# Patient Record
Sex: Female | Born: 1955 | Race: White | Hispanic: No | Marital: Married | Smoking: Never smoker
Health system: Southern US, Community
[De-identification: ages and names within clinical notes are randomized; demographics above are authoritative.]

## PROBLEM LIST (undated history)

## (undated) DIAGNOSIS — F32A Depression, unspecified: Secondary | ICD-10-CM

## (undated) DIAGNOSIS — F329 Major depressive disorder, single episode, unspecified: Secondary | ICD-10-CM

## (undated) DIAGNOSIS — R112 Nausea with vomiting, unspecified: Secondary | ICD-10-CM

## (undated) DIAGNOSIS — Z9889 Other specified postprocedural states: Secondary | ICD-10-CM

## (undated) HISTORY — PX: CHOLECYSTECTOMY: SHX55

## (undated) HISTORY — PX: OTHER SURGICAL HISTORY: SHX169

---

## 1990-05-12 HISTORY — PX: ABDOMINAL HYSTERECTOMY: SHX81

## 2001-10-10 ENCOUNTER — Encounter: Payer: Self-pay | Admitting: *Deleted

## 2001-10-10 ENCOUNTER — Ambulatory Visit (HOSPITAL_COMMUNITY): Admission: RE | Admit: 2001-10-10 | Discharge: 2001-10-10 | Payer: Self-pay | Admitting: *Deleted

## 2003-08-14 ENCOUNTER — Ambulatory Visit (HOSPITAL_COMMUNITY): Admission: RE | Admit: 2003-08-14 | Discharge: 2003-08-14 | Payer: Self-pay | Admitting: *Deleted

## 2004-12-26 ENCOUNTER — Ambulatory Visit (HOSPITAL_COMMUNITY): Admission: RE | Admit: 2004-12-26 | Discharge: 2004-12-26 | Payer: Self-pay | Admitting: Occupational Therapy

## 2005-11-13 ENCOUNTER — Emergency Department: Payer: Self-pay | Admitting: Emergency Medicine

## 2006-03-30 ENCOUNTER — Ambulatory Visit (HOSPITAL_COMMUNITY): Admission: RE | Admit: 2006-03-30 | Discharge: 2006-03-30 | Payer: Self-pay | Admitting: Nurse Practitioner

## 2008-04-25 ENCOUNTER — Ambulatory Visit (HOSPITAL_COMMUNITY): Admission: RE | Admit: 2008-04-25 | Discharge: 2008-04-25 | Payer: Self-pay | Admitting: Nurse Practitioner

## 2009-10-28 ENCOUNTER — Ambulatory Visit (HOSPITAL_COMMUNITY): Admission: RE | Admit: 2009-10-28 | Discharge: 2009-10-28 | Payer: Self-pay | Admitting: Nurse Practitioner

## 2011-04-14 ENCOUNTER — Other Ambulatory Visit (HOSPITAL_COMMUNITY): Payer: Self-pay | Admitting: Nurse Practitioner

## 2011-04-14 DIAGNOSIS — Z139 Encounter for screening, unspecified: Secondary | ICD-10-CM

## 2011-04-16 ENCOUNTER — Telehealth: Payer: Self-pay

## 2011-04-16 NOTE — Telephone Encounter (Signed)
Gastroenterology Pre-Procedure Form  Request Date: 04/14/2011   ,  Requesting Physician: Ninfa Linden, FNP     PATIENT INFORMATION:  Kelly Schultz is a 56 y.o., female (DOB=[birth year]).  PROCEDURE: Procedure(s) requested: colonoscopy Procedure Reason: screening for colon cancer  PATIENT REVIEW QUESTIONS: The patient reports the following:   1. Diabetes Melitis: no 2. Joint replacements in the past 12 months: no 3. Major health problems in the past 3 months: no 4. Has an artificial valve or MVP:no 5. Has been advised in past to take antibiotics in advance of a procedure like teeth cleaning: no}    MEDICATIONS & ALLERGIES:    Patient reports the following regarding taking any blood thinners:   Plavix? no Aspirin?no Coumadin?  no  Patient confirms/reports the following medications:  Current Outpatient Prescriptions  Medication Sig Dispense Refill  . buPROPion (WELLBUTRIN XL) 300 MG 24 hr tablet Take 300 mg by mouth daily.        . QUEtiapine (SEROQUEL) 50 MG tablet Take 50 mg by mouth at bedtime.        Marland Kitchen UNKNOWN TO PATIENT Vitamin D 1.25 twice weekly       . UNKNOWN TO PATIENT Vitamin D 1000 IU daily       . zolpidem (AMBIEN) 10 MG tablet Take 10 mg by mouth at bedtime as needed.          Patient confirms/reports the following allergies:  No Known Allergies  Patient is appropriate to schedule for requested procedure(s): yes  AUTHORIZATION INFORMATION Primary Insurance: BCBS    ID #: [id number]    Pre-Cert / Berkley Harvey required: Pre-Cert / Auth #:   Secondary Insurance: ,  ID #: ,  Group #:  Pre-Cert / Auth required: Pre-Cert / Auth #:  No orders of the defined types were placed in this encounter.    SCHEDULE INFORMATION: Procedure has been scheduled as follows:  Date: 04/28/2011    Time: 8:45 AM  Location: [facility] Short Stay  This Gastroenterology Pre-Precedure Form is being routed to the following provider(s) for review: [name], MD  Selena Batten is  aware of the appt.

## 2011-04-22 NOTE — Telephone Encounter (Signed)
MOVIPREP. 

## 2011-04-22 NOTE — Telephone Encounter (Signed)
MAY NEED PHENERGAN. 

## 2011-04-23 ENCOUNTER — Ambulatory Visit (HOSPITAL_COMMUNITY)
Admission: RE | Admit: 2011-04-23 | Discharge: 2011-04-23 | Disposition: A | Discharge status: Home / Self Care | Payer: BC Managed Care – PPO | Source: Ambulatory Visit | Attending: Nurse Practitioner | Admitting: Nurse Practitioner

## 2011-04-23 DIAGNOSIS — Z1231 Encounter for screening mammogram for malignant neoplasm of breast: Secondary | ICD-10-CM | POA: Insufficient documentation

## 2011-04-23 DIAGNOSIS — Z139 Encounter for screening, unspecified: Secondary | ICD-10-CM

## 2011-04-28 ENCOUNTER — Other Ambulatory Visit: Payer: Self-pay | Admitting: Gastroenterology

## 2011-04-28 ENCOUNTER — Encounter (HOSPITAL_COMMUNITY): Payer: Self-pay | Admitting: *Deleted

## 2011-04-28 ENCOUNTER — Encounter (HOSPITAL_COMMUNITY)
Admission: RE | Disposition: A | Discharge status: Home / Self Care | Payer: Self-pay | Source: Ambulatory Visit | Attending: Gastroenterology

## 2011-04-28 ENCOUNTER — Encounter: Payer: Self-pay | Admitting: Gastroenterology

## 2011-04-28 ENCOUNTER — Ambulatory Visit (HOSPITAL_COMMUNITY)
Admission: RE | Admit: 2011-04-28 | Discharge: 2011-04-28 | Disposition: A | Discharge status: Home / Self Care | Payer: BC Managed Care – PPO | Source: Ambulatory Visit | Attending: Gastroenterology | Admitting: Gastroenterology

## 2011-04-28 DIAGNOSIS — Z1211 Encounter for screening for malignant neoplasm of colon: Secondary | ICD-10-CM | POA: Insufficient documentation

## 2011-04-28 DIAGNOSIS — K648 Other hemorrhoids: Secondary | ICD-10-CM

## 2011-04-28 DIAGNOSIS — D128 Benign neoplasm of rectum: Secondary | ICD-10-CM | POA: Insufficient documentation

## 2011-04-28 DIAGNOSIS — D126 Benign neoplasm of colon, unspecified: Secondary | ICD-10-CM | POA: Insufficient documentation

## 2011-04-28 DIAGNOSIS — K5730 Diverticulosis of large intestine without perforation or abscess without bleeding: Secondary | ICD-10-CM

## 2011-04-28 DIAGNOSIS — D129 Benign neoplasm of anus and anal canal: Secondary | ICD-10-CM | POA: Insufficient documentation

## 2011-04-28 HISTORY — DX: Depression, unspecified: F32.A

## 2011-04-28 HISTORY — PX: COLONOSCOPY: SHX5424

## 2011-04-28 HISTORY — DX: Other specified postprocedural states: R11.2

## 2011-04-28 HISTORY — DX: Major depressive disorder, single episode, unspecified: F32.9

## 2011-04-28 HISTORY — DX: Other specified postprocedural states: Z98.890

## 2011-04-28 SURGERY — COLONOSCOPY
Anesthesia: Moderate Sedation

## 2011-04-28 MED ORDER — SODIUM CHLORIDE 0.45 % IV SOLN
Freq: Once | INTRAVENOUS | Status: AC
  Administered 2011-04-28: 09:00:00 via INTRAVENOUS

## 2011-04-28 MED ORDER — STERILE WATER FOR IRRIGATION IR SOLN
Status: DC | PRN
  Administered 2011-04-28: 10:00:00

## 2011-04-28 MED ORDER — PROMETHAZINE HCL 25 MG/ML IJ SOLN
INTRAMUSCULAR | Status: DC | PRN
  Administered 2011-04-28: 12.5 mg via INTRAVENOUS

## 2011-04-28 MED ORDER — MEPERIDINE HCL 100 MG/ML IJ SOLN
INTRAMUSCULAR | Status: DC | PRN
  Administered 2011-04-28: 25 mg via INTRAVENOUS
  Administered 2011-04-28: 50 mg via INTRAVENOUS
  Administered 2011-04-28: 25 mg via INTRAVENOUS

## 2011-04-28 MED ORDER — MEPERIDINE HCL 100 MG/ML IJ SOLN
INTRAMUSCULAR | Status: AC
  Filled 2011-04-28: qty 2

## 2011-04-28 MED ORDER — PROMETHAZINE HCL 25 MG/ML IJ SOLN
INTRAMUSCULAR | Status: AC
  Filled 2011-04-28: qty 1

## 2011-04-28 MED ORDER — MIDAZOLAM HCL 5 MG/5ML IJ SOLN
INTRAMUSCULAR | Status: DC | PRN
  Administered 2011-04-28 (×3): 2 mg via INTRAVENOUS

## 2011-04-28 MED ORDER — MIDAZOLAM HCL 5 MG/5ML IJ SOLN
INTRAMUSCULAR | Status: AC
  Filled 2011-04-28: qty 10

## 2011-04-28 MED ORDER — SODIUM CHLORIDE 0.9 % IJ SOLN
INTRAMUSCULAR | Status: AC
  Filled 2011-04-28: qty 10

## 2011-04-28 NOTE — Interval H&P Note (Signed)
History and Physical Interval Note:   04/28/2011   10:44 AM   Kelly Schultz  has presented today for surgery, with the diagnosis of screening  The various methods of treatment have been discussed with the patient and family. After consideration of risks, benefits and other options for treatment, the patient has consented to  Procedure(s): COLONOSCOPY as a surgical intervention .  I have reviewed the patients' chart and labs.  Questions were answered to the patient's satisfaction.     [name]  MD

## 2011-04-28 NOTE — H&P (Signed)
  Primary Care Physician:  Ninfa Linden, NP, NP Primary Gastroenterologist:  Dr. Darrick Penna  Pre-Procedure History & Physical: HPI:  Kelly Schultz is a 55 y.o. female here for screening TCS.  Past Medical History  Diagnosis Date  . PONV (postoperative nausea and vomiting)   . Depression     Past Surgical History  Procedure Date  . Cholecystectomy   . Cesarean section 10/23/1989  . Abdominal hysterectomy 05/1990  .  vertical ring gastroplasty     Prior to Admission medications   Medication Sig Start Date End Date Taking? Authorizing Provider  buPROPion (WELLBUTRIN XL) 300 MG 24 hr tablet Take 300 mg by mouth daily.     Yes Historical Provider, MD  QUEtiapine (SEROQUEL) 50 MG tablet Take 50 mg by mouth at bedtime.     Yes Historical Provider, MD  UNKNOWN TO PATIENT Vitamin D 1.25 twice weekly    Yes Historical Provider, MD  UNKNOWN TO PATIENT Vitamin D 1000 IU daily    Yes Historical Provider, MD  zolpidem (AMBIEN) 10 MG tablet Take 10 mg by mouth at bedtime as needed.     Yes Historical Provider, MD    Allergies as of 04/16/2011  . (No Known Allergies)    FAMHx: NO COLON CA OR POLYPS  History   Social History  . Marital Status: Married            .     Social History Main Topics  . Smoking status: Never Smoker   . Smokeless tobacco: Not on file  . Alcohol Use: No  . Drug Use: No  . Sexually Active:      Review of Systems: See HPI, otherwise negative ROS   Physical Exam: BP 127/86  Pulse 72  Temp(Src) 98.2 F (36.8 C) (Oral)  Resp 18  Ht 5\' 8"  (1.727 m)  Wt 108.863 kg (240 lb)  BMI 36.49 kg/m2  SpO2 100% General:   Alert,  pleasant and cooperative in NAD Head:  Normocephalic and atraumatic. Neck:  Supple; no masses or thyromegaly. Lungs:  Clear throughout to auscultation.    Heart:  Regular rate and rhythm. Abdomen:  Soft, nontender and nondistended. Normal bowel sounds, without guarding, and without rebound.   Neurologic:  Alert and  oriented  x4;  grossly normal neurologically.  Impression/Plan:   AVERAGE RISK FOR COLON CANCER. TCS TODAY. ALL QUESTIONS ANSWERED. RISKS AND BENEFITS DISCUSSED.

## 2011-05-05 ENCOUNTER — Telehealth: Payer: Self-pay | Admitting: Gastroenterology

## 2011-05-05 NOTE — Telephone Encounter (Signed)
Please call pt. 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 TCS in 5 years. High fiber diet. She should not get Demerol IV in the future because it cause phlebitis.  TCS in 5 years WITH OVERTUBE AND FENTANYL. HAD PHELBITIS WITH DEMEROL.

## 2011-05-06 NOTE — Telephone Encounter (Signed)
LMOM to call.

## 2011-05-06 NOTE — Telephone Encounter (Signed)
Pt informed

## 2011-05-06 NOTE — Telephone Encounter (Signed)
Path report Cc to Dr. Jarold Motto & 5 yr repeat TCS is in the computer

## 2011-05-07 ENCOUNTER — Encounter (HOSPITAL_COMMUNITY): Payer: Self-pay | Admitting: Gastroenterology

## 2013-12-06 ENCOUNTER — Other Ambulatory Visit (HOSPITAL_COMMUNITY): Payer: Self-pay | Admitting: Family Medicine

## 2013-12-06 DIAGNOSIS — Z1231 Encounter for screening mammogram for malignant neoplasm of breast: Secondary | ICD-10-CM

## 2013-12-14 ENCOUNTER — Ambulatory Visit (HOSPITAL_COMMUNITY)
Admission: RE | Admit: 2013-12-14 | Discharge: 2013-12-14 | Disposition: A | Discharge status: Home / Self Care | Payer: BC Managed Care – PPO | Source: Ambulatory Visit | Attending: Nurse Practitioner | Admitting: Nurse Practitioner

## 2013-12-14 DIAGNOSIS — Z1231 Encounter for screening mammogram for malignant neoplasm of breast: Secondary | ICD-10-CM | POA: Insufficient documentation

## 2015-10-01 ENCOUNTER — Other Ambulatory Visit (HOSPITAL_COMMUNITY): Payer: Self-pay | Admitting: Nurse Practitioner

## 2015-10-01 DIAGNOSIS — Z1231 Encounter for screening mammogram for malignant neoplasm of breast: Secondary | ICD-10-CM

## 2015-10-04 ENCOUNTER — Ambulatory Visit (HOSPITAL_COMMUNITY)
Admission: RE | Admit: 2015-10-04 | Discharge: 2015-10-04 | Disposition: A | Discharge status: Home / Self Care | Payer: BC Managed Care – PPO | Source: Ambulatory Visit | Attending: Nurse Practitioner | Admitting: Nurse Practitioner

## 2015-10-04 DIAGNOSIS — Z1231 Encounter for screening mammogram for malignant neoplasm of breast: Secondary | ICD-10-CM | POA: Diagnosis not present

## 2015-10-15 ENCOUNTER — Emergency Department
Admission: EM | Admit: 2015-10-15 | Discharge: 2015-10-15 | Disposition: A | Discharge status: Home / Self Care | Payer: BC Managed Care – PPO | Attending: Emergency Medicine | Admitting: Emergency Medicine

## 2015-10-15 ENCOUNTER — Emergency Department: Payer: BC Managed Care – PPO

## 2015-10-15 ENCOUNTER — Encounter: Payer: Self-pay | Admitting: *Deleted

## 2015-10-15 DIAGNOSIS — Y92002 Bathroom of unspecified non-institutional (private) residence single-family (private) house as the place of occurrence of the external cause: Secondary | ICD-10-CM | POA: Insufficient documentation

## 2015-10-15 DIAGNOSIS — S0990XA Unspecified injury of head, initial encounter: Secondary | ICD-10-CM | POA: Insufficient documentation

## 2015-10-15 DIAGNOSIS — Z79899 Other long term (current) drug therapy: Secondary | ICD-10-CM | POA: Insufficient documentation

## 2015-10-15 DIAGNOSIS — R55 Syncope and collapse: Secondary | ICD-10-CM | POA: Insufficient documentation

## 2015-10-15 DIAGNOSIS — W01198A Fall on same level from slipping, tripping and stumbling with subsequent striking against other object, initial encounter: Secondary | ICD-10-CM | POA: Insufficient documentation

## 2015-10-15 DIAGNOSIS — N390 Urinary tract infection, site not specified: Secondary | ICD-10-CM | POA: Insufficient documentation

## 2015-10-15 DIAGNOSIS — Y998 Other external cause status: Secondary | ICD-10-CM | POA: Diagnosis not present

## 2015-10-15 DIAGNOSIS — Y9389 Activity, other specified: Secondary | ICD-10-CM | POA: Diagnosis not present

## 2015-10-15 DIAGNOSIS — F419 Anxiety disorder, unspecified: Secondary | ICD-10-CM | POA: Insufficient documentation

## 2015-10-15 DIAGNOSIS — S060X1A Concussion with loss of consciousness of 30 minutes or less, initial encounter: Secondary | ICD-10-CM | POA: Diagnosis not present

## 2015-10-15 LAB — COMPREHENSIVE METABOLIC PANEL
ALBUMIN: 4.1 g/dL (ref 3.5–5.0)
ALK PHOS: 79 U/L (ref 38–126)
ALT: 40 U/L (ref 14–54)
AST: 44 U/L — ABNORMAL HIGH (ref 15–41)
Anion gap: 8 (ref 5–15)
BILIRUBIN TOTAL: 0.5 mg/dL (ref 0.3–1.2)
BUN: 10 mg/dL (ref 6–20)
CALCIUM: 8.9 mg/dL (ref 8.9–10.3)
CO2: 22 mmol/L (ref 22–32)
CREATININE: 0.95 mg/dL (ref 0.44–1.00)
Chloride: 105 mmol/L (ref 101–111)
GFR calc Af Amer: >60 mL/min (ref >60)
GFR calc non Af Amer: >60 mL/min (ref >60)
GLUCOSE: 127 mg/dL — AB (ref 65–99)
Potassium: 3.8 mmol/L (ref 3.5–5.1)
SODIUM: 135 mmol/L (ref 135–145)
TOTAL PROTEIN: 7.3 g/dL (ref 6.5–8.1)

## 2015-10-15 LAB — URINALYSIS COMPLETE WITH MICROSCOPIC (ARMC ONLY)
BILIRUBIN URINE: NEGATIVE
Glucose, UA: NEGATIVE mg/dL
Hgb urine dipstick: NEGATIVE
Ketones, ur: NEGATIVE mg/dL
Nitrite: NEGATIVE
PH: 7 (ref 5.0–8.0)
PROTEIN: 30 mg/dL — AB
Specific Gravity, Urine: 1.029 (ref 1.005–1.030)

## 2015-10-15 LAB — CBC WITH DIFFERENTIAL/PLATELET
Basophils Absolute: 0.1 10*3/uL (ref 0–0.1)
Basophils Relative: 1 %
EOS PCT: 0 %
Eosinophils Absolute: 0 10*3/uL (ref 0–0.7)
HCT: 39.7 % (ref 35.0–47.0)
Hemoglobin: 13.1 g/dL (ref 12.0–16.0)
LYMPHS ABS: 0.9 10*3/uL — AB (ref 1.0–3.6)
LYMPHS PCT: 9 %
MCH: 29.2 pg (ref 26.0–34.0)
MCHC: 33 g/dL (ref 32.0–36.0)
MCV: 88.3 fL (ref 80.0–100.0)
MONO ABS: 1 10*3/uL — AB (ref 0.2–0.9)
MONOS PCT: 11 %
Neutro Abs: 7.6 10*3/uL — ABNORMAL HIGH (ref 1.4–6.5)
Neutrophils Relative %: 79 %
PLATELETS: 285 10*3/uL (ref 150–440)
RBC: 4.5 MIL/uL (ref 3.80–5.20)
RDW: 14 % (ref 11.5–14.5)
WBC: 9.6 10*3/uL (ref 3.6–11.0)

## 2015-10-15 LAB — ETHANOL: Alcohol, Ethyl (B): <5 mg/dL (ref <5)

## 2015-10-15 LAB — TROPONIN I: Troponin I: <0.03 ng/mL (ref <0.031)

## 2015-10-15 MED ORDER — SODIUM CHLORIDE 0.9 % IV BOLUS (SEPSIS)
1000.0000 mL | Freq: Once | INTRAVENOUS | Status: AC
  Administered 2015-10-15: 1000 mL via INTRAVENOUS

## 2015-10-15 MED ORDER — NITROFURANTOIN MONOHYD MACRO 100 MG PO CAPS
100.0000 mg | ORAL_CAPSULE | Freq: Two times a day (BID) | ORAL | Status: AC
Start: 2015-10-15 — End: 2015-10-22

## 2015-10-15 NOTE — ED Provider Notes (Addendum)
[facility] Emergency Department Provider Note  ____________________________________________   I have reviewed the triage vital signs and the nursing notes.   HISTORY  Chief Complaint Loss of Consciousness    HPI Kelly Schultz is a 60 y.o. female Who is largely healthy aside from depression, who takes Adderall, Wellbutrin, Seroquel, Ambien. She took her Ambien last night before bed. She woke up this morning with take a shower and does not remember passing out. Her husband found her sitting in the shower after she fell. It is not clear she passed out or simply slipped and hit her head with a retrograde amnesia. He did not observe any seizure activity. Patient did not bite her tongue. She has no focal numbness or weakness. She is very anxious at this time. She did have recent diarrheal illness but is been better for the last few days. She has had no chest pain or shortness of breath. She states that she has no significant headache which she feels "weird". Patient speaks in a whisper.she did take all of her medications this morning before going to the shower and had not yet eaten. Denies alcohol abuse. Denies any recent GI bleed. Does not feel lightheaded right now. States sometimes she gets hot flashes.  Past Medical History  Diagnosis Date  . PONV (postoperative nausea and vomiting)   . Depression     There are no active problems to display for this patient.   Past Surgical History  Procedure Laterality Date  . Cholecystectomy    . Cesarean section  10/23/1989  . Abdominal hysterectomy  05/1990  .  vertical ring gastroplasty    . Colonoscopy  04/28/2011    Procedure: COLONOSCOPY;  Surgeon: Dorothyann Peng, MD;  Location: [facility];  Service: Endoscopy;  Laterality: N/A;    Current Outpatient Rx  Name  Route  Sig  Dispense  Refill  . buPROPion (WELLBUTRIN XL) 300 MG 24 hr tablet   Oral   Take 300 mg by mouth daily.           . QUEtiapine (SEROQUEL)  50 MG tablet   Oral   Take 50 mg by mouth at bedtime.           Marland Kitchen UNKNOWN TO PATIENT      Vitamin D 1.25 twice weekly          . UNKNOWN TO PATIENT      Vitamin D 1000 IU daily          . zolpidem (AMBIEN) 10 MG tablet   Oral   Take 10 mg by mouth at bedtime as needed.             Allergies Demerol  No family history on file.  Social History Social History  Substance Use Topics  . Smoking status: Never Smoker   . Smokeless tobacco: None  . Alcohol Use: No    Review of Systems Constitutional: No fever/chills Eyes: No visual changes. ENT: No sore throat. No stiff neck no neck pain Cardiovascular: Denies chest pain. Respiratory: Denies shortness of breath. Gastrointestinal:   no vomiting.  No ongoing diarrhea.  No constipation. Genitourinary: Negative for dysuria. Musculoskeletal: Negative lower extremity swelling Skin: Negative for rash. Neurological: Negative for headaches, focal weakness or numbness. 10-point ROS otherwise negative.  ____________________________________________   PHYSICAL EXAM:  VITAL SIGNS: ED Triage Vitals  Enc Vitals Group     BP 10/15/15 0716 149/95 mmHg     Pulse Rate 10/15/15 0716 123  Resp 10/15/15 0716 20     Temp 10/15/15 0716 98.6 F (37 C)     Temp Source 10/15/15 0716 Oral     SpO2 10/15/15 0716 96 %     Weight 10/15/15 0716 225 lb (102.059 kg)     Height 10/15/15 0716 5\' 8"  (1.727 m)     Head Cir --      Peak Flow --      Pain Score --      Pain Loc --      Pain Edu? --      Excl. in [place]? --     Constitutional: Alert and oriented. Well appearing and in no acute distress.patient is somewhat anxious when I enter the room her heart rate was 98 and then it went up to 121 and she talked to me. Her affect is somewhat unusual allergies she speaks in a whisper voice but when asked to speak bladder she has no difficulty doing so. Eyes: Conjunctivae are normal. PERRL. EOMI. Head: Atraumatic. Nose: No  congestion/rhinnorhea. Mouth/Throat: Mucous membranes are moist.  Oropharynx non-erythematous. Neck: No stridor.   Nontender with no meningismus Cardiovascular: Normal rate, regular rhythm. Grossly normal heart sounds.  Good peripheral circulation. Respiratory: Normal respiratory effort.  No retractions. Lungs CTAB. Abdominal: Soft and nontender. No distention. No guarding no rebound Back:  There is no focal tenderness or step off there is no midline tenderness there are no lesions noted. there is no CVA tenderness Musculoskeletal: No lower extremity tenderness. No joint effusions, no DVT signs strong distal pulses no edema Neurologic:  Normal speech and language. No gross focal neurologic deficits are appreciated. Cranial no C through 12 are grossly intact 5 out of 5 strength bilateral upper and lower extremity finger to nose within normal limits heel to shin within normal limits unremarkable neurologic Skin:  Skin is warm, dry and intact. No rash noted. Psychiatric: Mood and affect are normal. Speech and behavior are normal.  ____________________________________________   LABS (all labs ordered are listed, but only abnormal results are displayed)  Labs Reviewed  COMPREHENSIVE METABOLIC PANEL  ETHANOL  TROPONIN I  CBC WITH DIFFERENTIAL/PLATELET  URINALYSIS COMPLETEWITH MICROSCOPIC ([facility] ONLY)  CBG MONITORING, ED   ____________________________________________  EKG  I personally interpreted any EKGs ordered by me or triage Normal sinus tachycardia rate 110 bpm no acute ST elevation or acute ST depression normal axis ____________________________________________  RADIOLOGY  I reviewed any imaging ordered by me or triage that were performed during my shift ____________________________________________   PROCEDURES  Procedure(s) performed: None  Critical Care performed: None  ____________________________________________   INITIAL IMPRESSION / ASSESSMENT AND PLAN / ED  COURSE  Pertinent labs & imaging results that were available during my care of the patient were reviewed by me and considered in my medical decision making (see chart for details).  Patient either had a syncopal event this morning her symptoms shower and hit her head and doesn't remember doing it. She is neurologically intact. She feels somewhat "weird. She is quite anxious. There is no particular focality to her complaints. There is no seizure activity, there was no tongue bite, she had no antecedent or subsequent chest pain or shortness of breath. Her exam is reassuring. We will give her IV fluid. I do noted mild tachycardia but again this is very related to the patient's anxiety and it does certainly changes I enter the room and talked to her and after I leave her heart rate then goes back down. The patient  has no evidence of acute GI bleed we will check a CBC, no chest pain or shortness of breath but we'll check troponin and basic blood work, she has no dysuria or urinary symptoms at this time and we'll check a UA. She did have recent diarrhea will check potassium and other electrolytes. The patient denies alcohol abuse. She is on Ambien which can cause these kind of symptoms. She also takes several different medications before having any food this morning. Given that she hit the head and does not regarding so we'll obtain a CT scan of the head although have low suspicion for acute significant bleed  ----------------------------------------- 9:39 AM on 10/15/2015 -----------------------------------------  Patient remains with an NIH stroke scale of 0. She is feeling much better at this time. Again there is no evidence of actual syncope, patient certainly could've simply slipped in the shower . I do not feel that emergent hospitalization is warranted. Heart rate is 92 at this time vital signs are reassuring, she is eager to go home. We discussed admission butthe patient would very much like to go  home.there is some question of UTI on herUA we will send a culture and I will treat empirically. Extensive return precautions and follow-up given and understood especially for concussion repeat concussion symptoms or any cardiac symptoms. We will have her follow up with her primary care doctor, cardiology for a possible syncopal event, and we will treat her urinary tract infection. Patient has no symptoms or complaints at this time and would like to leave. Also made aware of mild proteinuria and need for f.u.    __________________________________   FINAL CLINICAL IMPRESSION(S) / ED DIAGNOSES  Final diagnoses:  None     Schuyler Amor, MD 10/15/15 [id number]  Schuyler Amor, MD 10/15/15 [place], MD 10/15/15 [place], MD 10/15/15 [place], MD 10/15/15 1025

## 2015-10-15 NOTE — ED Notes (Signed)
Pt given meal tray per edp, then will ambulate.

## 2015-10-15 NOTE — ED Notes (Signed)
Pt was in the shower, reports having a syncopal episode this morning

## 2015-10-15 NOTE — ED Notes (Signed)
Pt states she got up this am and went to get into the shower and does not remember anything after that. Husband states he heard her fall and she was sitting in the shower. Pt states she hit the right side of her head when she fell. Pt did not have any breakfast prior to taking all of her meds this am. Pt states she just does not feel right. Pt denies any dizzines prior to fall. Pt a/ox3. vss.

## 2015-10-15 NOTE — ED Notes (Signed)
Xray and ct scan completed.

## 2015-10-16 LAB — URINE CULTURE

## 2015-10-18 ENCOUNTER — Other Ambulatory Visit: Payer: Self-pay | Admitting: Nurse Practitioner

## 2015-10-18 DIAGNOSIS — R51 Headache: Principal | ICD-10-CM

## 2015-10-18 DIAGNOSIS — R519 Headache, unspecified: Secondary | ICD-10-CM

## 2015-10-22 ENCOUNTER — Ambulatory Visit
Admission: RE | Admit: 2015-10-22 | Discharge: 2015-10-22 | Disposition: A | Discharge status: Home / Self Care | Payer: BC Managed Care – PPO | Source: Ambulatory Visit | Attending: Nurse Practitioner | Admitting: Nurse Practitioner

## 2015-10-22 DIAGNOSIS — R55 Syncope and collapse: Secondary | ICD-10-CM | POA: Diagnosis not present

## 2015-10-22 DIAGNOSIS — X58XXXA Exposure to other specified factors, initial encounter: Secondary | ICD-10-CM | POA: Insufficient documentation

## 2015-10-22 DIAGNOSIS — R51 Headache: Secondary | ICD-10-CM | POA: Insufficient documentation

## 2015-10-22 DIAGNOSIS — S060X9A Concussion with loss of consciousness of unspecified duration, initial encounter: Secondary | ICD-10-CM | POA: Insufficient documentation

## 2015-10-22 DIAGNOSIS — R519 Headache, unspecified: Secondary | ICD-10-CM

## 2015-10-22 MED ORDER — IOHEXOL 300 MG/ML  SOLN
75.0000 mL | Freq: Once | INTRAMUSCULAR | Status: AC | PRN
  Administered 2015-10-22: 75 mL via INTRAVENOUS

## 2015-10-28 ENCOUNTER — Ambulatory Visit: Admission: RE | Admit: 2015-10-28 | Payer: BC Managed Care – PPO | Source: Ambulatory Visit

## 2015-11-01 DIAGNOSIS — R55 Syncope and collapse: Secondary | ICD-10-CM | POA: Insufficient documentation

## 2015-11-01 DIAGNOSIS — R0600 Dyspnea, unspecified: Secondary | ICD-10-CM | POA: Insufficient documentation

## 2015-11-14 DIAGNOSIS — R42 Dizziness and giddiness: Secondary | ICD-10-CM | POA: Insufficient documentation

## 2016-03-19 ENCOUNTER — Encounter: Payer: Self-pay | Admitting: Gastroenterology

## 2017-06-18 ENCOUNTER — Other Ambulatory Visit (HOSPITAL_COMMUNITY): Payer: Self-pay | Admitting: Nurse Practitioner

## 2017-06-18 DIAGNOSIS — Z1231 Encounter for screening mammogram for malignant neoplasm of breast: Secondary | ICD-10-CM

## 2017-07-21 ENCOUNTER — Ambulatory Visit (HOSPITAL_COMMUNITY)
Admission: RE | Admit: 2017-07-21 | Discharge: 2017-07-21 | Disposition: A | Discharge status: Home / Self Care | Payer: BC Managed Care – PPO | Source: Ambulatory Visit | Attending: Nurse Practitioner | Admitting: Nurse Practitioner

## 2017-07-21 DIAGNOSIS — Z1231 Encounter for screening mammogram for malignant neoplasm of breast: Secondary | ICD-10-CM | POA: Insufficient documentation

## 2018-07-09 ENCOUNTER — Emergency Department: Payer: BC Managed Care – PPO

## 2018-07-09 ENCOUNTER — Emergency Department: Payer: BC Managed Care – PPO | Admitting: Anesthesiology

## 2018-07-09 ENCOUNTER — Observation Stay
Admission: EM | Admit: 2018-07-09 | Discharge: 2018-07-11 | Disposition: A | Discharge status: Home Health | Payer: BC Managed Care – PPO | Attending: Surgery | Admitting: Surgery

## 2018-07-09 ENCOUNTER — Encounter
Admission: EM | Disposition: A | Discharge status: Home Health | Payer: Self-pay | Source: Home / Self Care | Attending: Emergency Medicine

## 2018-07-09 ENCOUNTER — Other Ambulatory Visit: Payer: Self-pay

## 2018-07-09 ENCOUNTER — Encounter: Payer: Self-pay | Admitting: Emergency Medicine

## 2018-07-09 DIAGNOSIS — F329 Major depressive disorder, single episode, unspecified: Secondary | ICD-10-CM | POA: Insufficient documentation

## 2018-07-09 DIAGNOSIS — S82842A Displaced bimalleolar fracture of left lower leg, initial encounter for closed fracture: Principal | ICD-10-CM | POA: Insufficient documentation

## 2018-07-09 DIAGNOSIS — S82892A Other fracture of left lower leg, initial encounter for closed fracture: Secondary | ICD-10-CM | POA: Diagnosis present

## 2018-07-09 DIAGNOSIS — X501XXA Overexertion from prolonged static or awkward postures, initial encounter: Secondary | ICD-10-CM | POA: Insufficient documentation

## 2018-07-09 DIAGNOSIS — I10 Essential (primary) hypertension: Secondary | ICD-10-CM | POA: Insufficient documentation

## 2018-07-09 DIAGNOSIS — Z79899 Other long term (current) drug therapy: Secondary | ICD-10-CM | POA: Insufficient documentation

## 2018-07-09 DIAGNOSIS — Z419 Encounter for procedure for purposes other than remedying health state, unspecified: Secondary | ICD-10-CM

## 2018-07-09 HISTORY — PX: ORIF ANKLE FRACTURE: SHX5408

## 2018-07-09 LAB — CBC
HEMATOCRIT: 33 % — AB (ref 35.0–47.0)
HEMOGLOBIN: 12 g/dL (ref 12.0–16.0)
MCH: 32.3 pg (ref 26.0–34.0)
MCHC: 36.3 g/dL — AB (ref 32.0–36.0)
MCV: 89 fL (ref 80.0–100.0)
Platelets: 331 10*3/uL (ref 150–440)
RBC: 3.71 MIL/uL — AB (ref 3.80–5.20)
RDW: 13.3 % (ref 11.5–14.5)
WBC: 8 10*3/uL (ref 3.6–11.0)

## 2018-07-09 LAB — BASIC METABOLIC PANEL
Anion gap: 9 (ref 5–15)
BUN: 18 mg/dL (ref 8–23)
CHLORIDE: 96 mmol/L — AB (ref 98–111)
CO2: 26 mmol/L (ref 22–32)
Calcium: 9.2 mg/dL (ref 8.9–10.3)
Creatinine, Ser: 1.05 mg/dL — ABNORMAL HIGH (ref 0.44–1.00)
GFR calc non Af Amer: 56 mL/min — ABNORMAL LOW (ref >60)
Glucose, Bld: 110 mg/dL — ABNORMAL HIGH (ref 70–99)
POTASSIUM: 4.3 mmol/L (ref 3.5–5.1)
SODIUM: 131 mmol/L — AB (ref 135–145)

## 2018-07-09 LAB — TYPE AND SCREEN
ABO/RH(D): A POS
ANTIBODY SCREEN: NEGATIVE

## 2018-07-09 LAB — PROTIME-INR
INR: 0.9
PROTHROMBIN TIME: 12.1 s (ref 11.4–15.2)

## 2018-07-09 SURGERY — OPEN REDUCTION INTERNAL FIXATION (ORIF) ANKLE FRACTURE
Anesthesia: Spinal | Site: Ankle | Laterality: Left

## 2018-07-09 MED ORDER — FENTANYL CITRATE (PF) 100 MCG/2ML IJ SOLN
INTRAMUSCULAR | Status: AC
  Filled 2018-07-09: qty 2

## 2018-07-09 MED ORDER — EPHEDRINE SULFATE 50 MG/ML IJ SOLN
5.0000 mg | Freq: Once | INTRAMUSCULAR | Status: AC
  Administered 2018-07-09: 5 mg via INTRAVENOUS

## 2018-07-09 MED ORDER — SODIUM CHLORIDE 0.9 % IV SOLN
Freq: Once | INTRAVENOUS | Status: AC
  Administered 2018-07-09: 13:00:00 via INTRAVENOUS

## 2018-07-09 MED ORDER — EPHEDRINE SULFATE 50 MG/ML IJ SOLN
INTRAMUSCULAR | Status: DC | PRN
  Administered 2018-07-09 (×3): 10 mg via INTRAVENOUS

## 2018-07-09 MED ORDER — CEFAZOLIN SODIUM 1 G IJ SOLR
INTRAMUSCULAR | Status: AC
  Filled 2018-07-09: qty 20

## 2018-07-09 MED ORDER — EPHEDRINE SULFATE 50 MG/ML IJ SOLN
INTRAMUSCULAR | Status: AC
  Filled 2018-07-09: qty 1

## 2018-07-09 MED ORDER — ONDANSETRON HCL 4 MG/2ML IJ SOLN: 4.0000 mg | Freq: Once | INTRAMUSCULAR | Status: DC | PRN

## 2018-07-09 MED ORDER — PHENYLEPHRINE HCL 10 MG/ML IJ SOLN
INTRAMUSCULAR | Status: DC | PRN
  Administered 2018-07-09: 100 ug via INTRAVENOUS
  Administered 2018-07-09: 200 ug via INTRAVENOUS
  Administered 2018-07-09: 100 ug via INTRAVENOUS
  Administered 2018-07-09: 200 ug via INTRAVENOUS

## 2018-07-09 MED ORDER — KETOROLAC TROMETHAMINE 15 MG/ML IJ SOLN
15.0000 mg | Freq: Four times a day (QID) | INTRAMUSCULAR | Status: AC
  Administered 2018-07-09 – 2018-07-10 (×3): 15 mg via INTRAVENOUS
  Filled 2018-07-09 (×3): qty 1

## 2018-07-09 MED ORDER — MIDAZOLAM HCL 5 MG/5ML IJ SOLN
INTRAMUSCULAR | Status: DC | PRN
  Administered 2018-07-09 (×2): 2 mg via INTRAVENOUS

## 2018-07-09 MED ORDER — CEFAZOLIN SODIUM-DEXTROSE 2-3 GM-%(50ML) IV SOLR
INTRAVENOUS | Status: DC | PRN
  Administered 2018-07-09: 2 g via INTRAVENOUS

## 2018-07-09 MED ORDER — DIPHENHYDRAMINE HCL 50 MG/ML IJ SOLN
INTRAMUSCULAR | Status: DC | PRN
  Administered 2018-07-09: 12.5 mg via INTRAVENOUS

## 2018-07-09 MED ORDER — DIPHENHYDRAMINE HCL 12.5 MG/5ML PO ELIX: 12.5000 mg | ORAL_SOLUTION | ORAL | Status: DC | PRN

## 2018-07-09 MED ORDER — METOCLOPRAMIDE HCL 10 MG PO TABS: 5.0000 mg | ORAL_TABLET | Freq: Three times a day (TID) | ORAL | Status: DC | PRN

## 2018-07-09 MED ORDER — CEFAZOLIN SODIUM-DEXTROSE 2-4 GM/100ML-% IV SOLN
2.0000 g | Freq: Four times a day (QID) | INTRAVENOUS | Status: AC
  Administered 2018-07-09 – 2018-07-10 (×3): 2 g via INTRAVENOUS
  Filled 2018-07-09 (×3): qty 100

## 2018-07-09 MED ORDER — OXYCODONE HCL 5 MG PO TABS
5.0000 mg | ORAL_TABLET | ORAL | Status: DC | PRN
  Administered 2018-07-09: 5 mg via ORAL
  Administered 2018-07-09 – 2018-07-11 (×4): 10 mg via ORAL
  Administered 2018-07-11: 5 mg via ORAL
  Filled 2018-07-09 (×2): qty 1
  Filled 2018-07-09 (×4): qty 2

## 2018-07-09 MED ORDER — MIDAZOLAM HCL 2 MG/2ML IJ SOLN
INTRAMUSCULAR | Status: AC
  Filled 2018-07-09: qty 2

## 2018-07-09 MED ORDER — ACETAMINOPHEN 500 MG PO TABS: 1000.0000 mg | ORAL_TABLET | Freq: Once | ORAL | Status: DC

## 2018-07-09 MED ORDER — ONDANSETRON HCL 4 MG/2ML IJ SOLN
4.0000 mg | Freq: Once | INTRAMUSCULAR | Status: AC
  Administered 2018-07-09: 4 mg via INTRAVENOUS
  Filled 2018-07-09: qty 2

## 2018-07-09 MED ORDER — PROPOFOL 10 MG/ML IV BOLUS
INTRAVENOUS | Status: AC
  Filled 2018-07-09: qty 20

## 2018-07-09 MED ORDER — ZOLPIDEM TARTRATE 5 MG PO TABS
5.0000 mg | ORAL_TABLET | Freq: Every evening | ORAL | Status: DC | PRN
  Administered 2018-07-09 – 2018-07-10 (×2): 5 mg via ORAL
  Filled 2018-07-09 (×2): qty 1

## 2018-07-09 MED ORDER — SODIUM CHLORIDE 0.9 % IV SOLN
INTRAVENOUS | Status: DC
  Administered 2018-07-09 – 2018-07-10 (×3): via INTRAVENOUS

## 2018-07-09 MED ORDER — ENOXAPARIN SODIUM 40 MG/0.4ML ~~LOC~~ SOLN
40.0000 mg | SUBCUTANEOUS | Status: DC
  Administered 2018-07-10 – 2018-07-11 (×2): 40 mg via SUBCUTANEOUS
  Filled 2018-07-09 (×2): qty 0.4

## 2018-07-09 MED ORDER — BUPROPION HCL ER (XL) 150 MG PO TB24
300.0000 mg | ORAL_TABLET | Freq: Every day | ORAL | Status: DC
  Administered 2018-07-10 – 2018-07-11 (×2): 300 mg via ORAL
  Filled 2018-07-09 (×2): qty 2

## 2018-07-09 MED ORDER — FENTANYL CITRATE (PF) 100 MCG/2ML IJ SOLN
25.0000 ug | INTRAMUSCULAR | Status: DC | PRN
  Administered 2018-07-09: 25 ug via INTRAVENOUS

## 2018-07-09 MED ORDER — HYDROMORPHONE HCL 1 MG/ML IJ SOLN
0.5000 mg | INTRAMUSCULAR | Status: DC | PRN
  Administered 2018-07-09: 0.5 mg via INTRAVENOUS
  Filled 2018-07-09: qty 1

## 2018-07-09 MED ORDER — NEOMYCIN-POLYMYXIN B GU 40-200000 IR SOLN
Status: AC
  Filled 2018-07-09: qty 4

## 2018-07-09 MED ORDER — GLYCOPYRROLATE 0.2 MG/ML IJ SOLN
INTRAMUSCULAR | Status: DC | PRN
  Administered 2018-07-09: 0.2 mg via INTRAVENOUS

## 2018-07-09 MED ORDER — LISDEXAMFETAMINE DIMESYLATE 20 MG PO CAPS
40.0000 mg | ORAL_CAPSULE | Freq: Every day | ORAL | Status: DC
  Administered 2018-07-10: 40 mg via ORAL
  Filled 2018-07-09: qty 2

## 2018-07-09 MED ORDER — BUPIVACAINE HCL (PF) 0.5 % IJ SOLN
INTRAMUSCULAR | Status: DC | PRN
  Administered 2018-07-09: 3 mL via INTRATHECAL

## 2018-07-09 MED ORDER — PHENYLEPHRINE HCL 10 MG/ML IJ SOLN
INTRAMUSCULAR | Status: AC
  Filled 2018-07-09: qty 1

## 2018-07-09 MED ORDER — SODIUM CHLORIDE FLUSH 0.9 % IV SOLN
INTRAVENOUS | Status: AC
  Filled 2018-07-09: qty 10

## 2018-07-09 MED ORDER — OXYCODONE HCL 5 MG PO TABS: 5.0000 mg | ORAL_TABLET | Freq: Once | ORAL | Status: DC

## 2018-07-09 MED ORDER — LACTATED RINGERS IV SOLN
INTRAVENOUS | Status: DC | PRN
  Administered 2018-07-09: 10:00:00 via INTRAVENOUS

## 2018-07-09 MED ORDER — PANTOPRAZOLE SODIUM 40 MG PO TBEC
40.0000 mg | DELAYED_RELEASE_TABLET | Freq: Every day | ORAL | Status: DC
  Administered 2018-07-10 – 2018-07-11 (×2): 40 mg via ORAL
  Filled 2018-07-09 (×2): qty 1

## 2018-07-09 MED ORDER — BUPIVACAINE HCL (PF) 0.5 % IJ SOLN
INTRAMUSCULAR | Status: AC
  Filled 2018-07-09: qty 10

## 2018-07-09 MED ORDER — METOCLOPRAMIDE HCL 5 MG/ML IJ SOLN: 5.0000 mg | Freq: Three times a day (TID) | INTRAMUSCULAR | Status: DC | PRN

## 2018-07-09 MED ORDER — LISINOPRIL 20 MG PO TABS
20.0000 mg | ORAL_TABLET | Freq: Two times a day (BID) | ORAL | Status: DC
  Administered 2018-07-09 – 2018-07-10 (×3): 20 mg via ORAL
  Filled 2018-07-09 (×3): qty 1

## 2018-07-09 MED ORDER — MENTHOL 3 MG MT LOZG
1.0000 | LOZENGE | OROMUCOSAL | Status: DC | PRN
  Filled 2018-07-09: qty 9

## 2018-07-09 MED ORDER — DIPHENHYDRAMINE HCL 50 MG/ML IJ SOLN
INTRAMUSCULAR | Status: AC
  Filled 2018-07-09: qty 1

## 2018-07-09 MED ORDER — GLYCOPYRROLATE 0.2 MG/ML IJ SOLN
INTRAMUSCULAR | Status: AC
  Filled 2018-07-09: qty 1

## 2018-07-09 MED ORDER — ACETAMINOPHEN 500 MG PO TABS
1000.0000 mg | ORAL_TABLET | Freq: Four times a day (QID) | ORAL | Status: AC
  Administered 2018-07-09 – 2018-07-10 (×3): 1000 mg via ORAL
  Filled 2018-07-09 (×3): qty 2

## 2018-07-09 MED ORDER — PROPOFOL 500 MG/50ML IV EMUL
INTRAVENOUS | Status: DC | PRN
  Administered 2018-07-09: 25 ug/kg/min via INTRAVENOUS

## 2018-07-09 MED ORDER — FLEET ENEMA 7-19 GM/118ML RE ENEM: 1.0000 | ENEMA | Freq: Once | RECTAL | Status: DC | PRN

## 2018-07-09 MED ORDER — QUETIAPINE FUMARATE 100 MG PO TABS
150.0000 mg | ORAL_TABLET | Freq: Every day | ORAL | Status: DC
  Administered 2018-07-09 – 2018-07-10 (×2): 150 mg via ORAL
  Filled 2018-07-09 (×3): qty 1.5
  Filled 2018-07-09 (×2): qty 6

## 2018-07-09 MED ORDER — BUPIVACAINE HCL (PF) 0.5 % IJ SOLN
INTRAMUSCULAR | Status: AC
  Filled 2018-07-09: qty 30

## 2018-07-09 MED ORDER — LIDOCAINE HCL (PF) 2 % IJ SOLN
INTRAMUSCULAR | Status: AC
  Filled 2018-07-09: qty 10

## 2018-07-09 MED ORDER — NEOMYCIN-POLYMYXIN B GU 40-200000 IR SOLN
Status: DC | PRN
  Administered 2018-07-09: 4 mL

## 2018-07-09 MED ORDER — SODIUM CHLORIDE 0.9 % IV SOLN
INTRAVENOUS | Status: DC | PRN
  Administered 2018-07-09: 30 ug/min via INTRAVENOUS

## 2018-07-09 MED ORDER — MAGNESIUM HYDROXIDE 400 MG/5ML PO SUSP: 30.0000 mL | Freq: Every day | ORAL | Status: DC | PRN

## 2018-07-09 MED ORDER — ACETAMINOPHEN 325 MG PO TABS: 325.0000 mg | ORAL_TABLET | Freq: Four times a day (QID) | ORAL | Status: DC | PRN

## 2018-07-09 MED ORDER — MORPHINE SULFATE (PF) 4 MG/ML IV SOLN
4.0000 mg | Freq: Once | INTRAVENOUS | Status: AC
  Administered 2018-07-09: 4 mg via INTRAVENOUS
  Filled 2018-07-09: qty 1

## 2018-07-09 MED ORDER — SIMVASTATIN 20 MG PO TABS
10.0000 mg | ORAL_TABLET | Freq: Every day | ORAL | Status: DC
  Administered 2018-07-09 – 2018-07-10 (×2): 10 mg via ORAL
  Filled 2018-07-09 (×2): qty 1

## 2018-07-09 MED ORDER — FENTANYL CITRATE (PF) 100 MCG/2ML IJ SOLN
INTRAMUSCULAR | Status: DC | PRN
  Administered 2018-07-09 (×2): 50 ug via INTRAVENOUS

## 2018-07-09 MED ORDER — TRAMADOL HCL 50 MG PO TABS
50.0000 mg | ORAL_TABLET | Freq: Four times a day (QID) | ORAL | Status: DC | PRN
  Administered 2018-07-09 – 2018-07-10 (×3): 50 mg via ORAL
  Filled 2018-07-09 (×3): qty 1

## 2018-07-09 MED ORDER — BUPIVACAINE HCL (PF) 0.5 % IJ SOLN
INTRAMUSCULAR | Status: DC | PRN
  Administered 2018-07-09: 20 mL

## 2018-07-09 MED ORDER — ONDANSETRON HCL 4 MG PO TABS: 4.0000 mg | ORAL_TABLET | Freq: Four times a day (QID) | ORAL | Status: DC | PRN

## 2018-07-09 MED ORDER — ONDANSETRON HCL 4 MG/2ML IJ SOLN: 4.0000 mg | Freq: Four times a day (QID) | INTRAMUSCULAR | Status: DC | PRN

## 2018-07-09 MED ORDER — PROPOFOL 500 MG/50ML IV EMUL
INTRAVENOUS | Status: AC
  Filled 2018-07-09: qty 50

## 2018-07-09 MED ORDER — DOCUSATE SODIUM 100 MG PO CAPS
100.0000 mg | ORAL_CAPSULE | Freq: Two times a day (BID) | ORAL | Status: DC
  Administered 2018-07-09 – 2018-07-11 (×4): 100 mg via ORAL
  Filled 2018-07-09 (×4): qty 1

## 2018-07-09 MED ORDER — BISACODYL 10 MG RE SUPP: 10.0000 mg | Freq: Every day | RECTAL | Status: DC | PRN

## 2018-07-09 MED ORDER — LIDOCAINE HCL (PF) 2 % IJ SOLN
INTRAMUSCULAR | Status: DC | PRN
  Administered 2018-07-09: 25 mg

## 2018-07-09 SURGICAL SUPPLY — 65 items
BANDAGE ACE 4X5 VEL STRL LF (GAUZE/BANDAGES/DRESSINGS) ×6 IMPLANT
BANDAGE ACE 6X5 VEL STRL LF (GAUZE/BANDAGES/DRESSINGS) ×5 IMPLANT
BIT DRILL 2.5X2.75 QC CALB (BIT) ×2 IMPLANT
BIT DRILL 3.5X5.5 QC CALB (BIT) ×2 IMPLANT
BIT DRILL CALIBRATED 2.7 (BIT) ×1 IMPLANT
BIT DRILL CALIBRATED 2.7MM (BIT) ×1
BLADE SURG SZ10 CARB STEEL (BLADE) ×6 IMPLANT
BNDG COHESIVE 4X5 TAN STRL (GAUZE/BANDAGES/DRESSINGS) ×3 IMPLANT
BNDG ESMARK 6X12 TAN STRL LF (GAUZE/BANDAGES/DRESSINGS) ×3 IMPLANT
BNDG PLASTER FAST 4X5 WHT LF (CAST SUPPLIES) ×4 IMPLANT
BNDG PLSTR 5X4 FST ST WHT LF (CAST SUPPLIES)
CANISTER SUCT 1200ML W/VALVE (MISCELLANEOUS) ×3 IMPLANT
CHLORAPREP W/TINT 26ML (MISCELLANEOUS) ×6 IMPLANT
CUFF TOURN 24 STER (MISCELLANEOUS) IMPLANT
CUFF TOURN 30 STER DUAL PORT (MISCELLANEOUS) ×2 IMPLANT
DRAPE C-ARM XRAY 36X54 (DRAPES) ×3 IMPLANT
DRAPE C-ARMOR (DRAPES) ×3 IMPLANT
DRAPE INCISE IOBAN 66X45 STRL (DRAPES) ×3 IMPLANT
DRAPE U-SHAPE 47X51 STRL (DRAPES) ×3 IMPLANT
ELECT CAUTERY BLADE 6.4 (BLADE) ×3 IMPLANT
ELECT REM PT RETURN 9FT ADLT (ELECTROSURGICAL) ×3
ELECTRODE REM PT RTRN 9FT ADLT (ELECTROSURGICAL) ×1 IMPLANT
GAUZE PETRO XEROFOAM 1X8 (MISCELLANEOUS) ×3 IMPLANT
GAUZE SPONGE 4X4 12PLY STRL (GAUZE/BANDAGES/DRESSINGS) ×3 IMPLANT
GLOVE BIO SURGEON STRL SZ8 (GLOVE) ×6 IMPLANT
GLOVE INDICATOR 8.0 STRL GRN (GLOVE) ×3 IMPLANT
GOWN STRL REUS W/ TWL LRG LVL3 (GOWN DISPOSABLE) ×1 IMPLANT
GOWN STRL REUS W/ TWL XL LVL3 (GOWN DISPOSABLE) ×1 IMPLANT
GOWN STRL REUS W/TWL LRG LVL3 (GOWN DISPOSABLE) ×3
GOWN STRL REUS W/TWL XL LVL3 (GOWN DISPOSABLE) ×3
HEMOVAC 400ML (MISCELLANEOUS)
HOLDER FOLEY CATH W/STRAP (MISCELLANEOUS) ×2 IMPLANT
K-WIRE 1.6 (WIRE) ×3
K-WIRE FX5X1.6XNS BN SS (WIRE) ×1
KIT DRAIN HEMOVAC JP 7FR 400ML (MISCELLANEOUS) ×1 IMPLANT
KIT TURNOVER KIT A (KITS) ×3 IMPLANT
KWIRE FX5X1.6XNS BN SS (WIRE) IMPLANT
LABEL OR SOLS (LABEL) ×3 IMPLANT
NS IRRIG 1000ML POUR BTL (IV SOLUTION) ×3 IMPLANT
PACK EXTREMITY ARMC (MISCELLANEOUS) ×3 IMPLANT
PAD ABD DERMACEA PRESS 5X9 (GAUZE/BANDAGES/DRESSINGS) ×6 IMPLANT
PAD CAST CTTN 4X4 STRL (SOFTGOODS) ×2 IMPLANT
PAD PREP 24X41 OB/GYN DISP (PERSONAL CARE ITEMS) ×3 IMPLANT
PADDING CAST 4IN STRL (MISCELLANEOUS) ×2
PADDING CAST BLEND 4X4 STRL (MISCELLANEOUS) IMPLANT
PADDING CAST COTTON 4X4 STRL (SOFTGOODS) ×9
PLATE LOCK 7H 92 BILAT FIB (Plate) ×2 IMPLANT
SCREW CORTICAL 3.5MM  20MM (Screw) ×2 IMPLANT
SCREW CORTICAL 3.5MM 20MM (Screw) IMPLANT
SCREW CORTICAL 3.5MM 24MM (Screw) ×2 IMPLANT
SCREW CORTICAL LOW PROF 3.5X20 (Screw) ×2 IMPLANT
SCREW LOCK 3.5X18 DIST TIB (Screw) ×2 IMPLANT
SCREW LOCK CORT STAR 3.5X10 (Screw) ×2 IMPLANT
SCREW LOCK CORT STAR 3.5X14 (Screw) ×2 IMPLANT
SCREW LOW PROFILE 18MMX3.5MM (Screw) ×2 IMPLANT
SCREW NON LOCKING LP 3.5 16MM (Screw) ×2 IMPLANT
SPLINT CAST 1 STEP 5X30 WHT (MISCELLANEOUS) ×4 IMPLANT
SPONGE LAP 18X18 RF (DISPOSABLE) ×3 IMPLANT
STAPLER SKIN PROX 35W (STAPLE) ×3 IMPLANT
STOCKINETTE IMPERV 14X48 (MISCELLANEOUS) ×3 IMPLANT
SUT VIC AB 0 CT1 36 (SUTURE) ×3 IMPLANT
SUT VIC AB 2-0 SH 27 (SUTURE) ×6
SUT VIC AB 2-0 SH 27XBRD (SUTURE) ×2 IMPLANT
SYR 10ML LL (SYRINGE) ×3 IMPLANT
TRAY FOLEY SLVR 16FR LF STAT (SET/KITS/TRAYS/PACK) ×2 IMPLANT

## 2018-07-09 NOTE — Anesthesia Procedure Notes (Signed)
Spinal  Patient location during procedure: OR Staffing Anesthesiologist: Kephart, William K, MD Resident/CRNA: Dreonna Hussein, CRNA Performed: resident/CRNA  Preanesthetic Checklist Completed: patient identified, site marked, surgical consent, pre-op evaluation, timeout performed, IV checked, risks and benefits discussed and monitors and equipment checked Spinal Block Patient position: sitting Prep: ChloraPrep and site prepped and draped Patient monitoring: heart rate, continuous pulse ox, blood pressure and cardiac monitor Approach: midline Location: L4-5 Injection technique: single-shot Needle Needle type: Introducer and Pencan  Needle gauge: 24 G Needle length: 9 cm Additional Notes Negative paresthesia. Negative blood return. Positive free-flowing CSF. Expiration date of kit checked and confirmed. Patient tolerated procedure well, without complications.       

## 2018-07-09 NOTE — Op Note (Signed)
07/09/2018  12:05 PM  Patient:   Kelly Schultz  Pre-Op Diagnosis:   Closed unstable bimalleolar fracture, left ankle.  Post-Op Diagnosis:   Same.  Procedure:   Open reduction and internal fixation of unstable distal fibular fracture, left ankle.  Surgeon:   Pascal Lux, MD  Assistant:   None  Anesthesia:   Spinal  Findings:   As above.  Complications:   None  EBL:   10 cc  Fluids:   700 cc crystalloid  UOP:   850 cc  TT:   66 min at 275 mmHg  Drains:   None  Closure:   Staples  Implants:   Biomet ALPS 4-hole distal fibular locking plate and screws  Brief Clinical Note:   The patient is a 62 year old female who sustained the above-noted injury earlier this morning when she apparently lost her balance and fell while getting up to go the bathroom.  She was brought to the emergency room where x-rays demonstrated the above-noted injury. The patient presents at this time for definitive management of her injury.  Procedure:   The patient was brought into the operating room. After adequate spinal anesthesia was obtained, the patient was lain in the supine position. The left foot and lower leg were prepped with ChloraPrep solution, then draped sterilely. Preoperative antibiotics were administered. A timeout was performed to verify the appropriate surgical site before the limb was exsanguinated with an Esmarch and the calf tourniquet inflated to 275 mmHg. Laterally, a 5-6 cm incision was made over the lateral aspect of the distal fibula. The incision was carried down through the subcutaneous tissues to expose the fracture site. The fracture hematoma was debrided before the fracture was reduced and temporarily secured using a bone clamp. Two lag screws were placed in an anterior to posterior direction perpendicular to the fracture. A 7-hole composite locking plate was applied over the lateral aspect of the distal fibula after contouring the distal end of the plate with the  appropriate plate benders. After verifying its position fluoroscopically, it was secured using a 3.5 mm nonlocking cortical screw proximal to the fracture. Again the plate's position was adjusted slightly based on AP and lateral projections before it was secured using two additional bicortical screws proximally and four locking screws distally. The adequacy of fracture reduction and hardware position was verified fluoroscopically in AP and lateral projections and found to be excellent.   The wound was copiously irrigated with sterile saline solution. The subcutaneous tissues were closed in two layers using #0 and 2-0 Vicryl interrupted sutures before the skin was closed using staples. A total of 20 cc of 0.5% plain Sensorcaine was injected in and around the incision site to help with postoperative analgesia. A sterile bulky dressing was applied to the wound before the patient was placed into a posterior splint with a sugar tong supplement, maintaining the ankle in neutral dorsiflexion. The patient was then awakened and returned to the recovery room in satisfactory condition after tolerating the procedure well.

## 2018-07-09 NOTE — ED Provider Notes (Signed)
[facility] Emergency Department Provider Note  ____________________________________________  Time seen: Approximately 7:23 AM  I have reviewed the triage vital signs and the nursing notes.   HISTORY  Chief Complaint Ankle Pain   HPI Kelly Schultz is a 62 y.o. female who presents for evaluation of left ankle pain.  Patient reports that she was standing up to go to the bathroom at 4 AM when she twisted her ankle.  She reports that she has been unable to bear weight since this happened.  She is complaining of 8 out of 10 pain located in the medial malleolus, worse with movement or weight bearing. No fall or any other trauma. No prior surgery or fracture of this ankle.  Past Medical History:  Diagnosis Date  . Depression   . PONV (postoperative nausea and vomiting)     There are no active problems to display for this patient.   Past Surgical History:  Procedure Laterality Date  .  vertical ring gastroplasty    . ABDOMINAL HYSTERECTOMY  05/1990  . CESAREAN SECTION  10/23/1989  . CHOLECYSTECTOMY    . COLONOSCOPY  04/28/2011   Procedure: COLONOSCOPY;  Surgeon: Dorothyann Peng, MD;  Location: [facility];  Service: Endoscopy;  Laterality: N/A;    Prior to Admission medications   Medication Sig Start Date End Date Taking? Authorizing Provider  amphetamine-dextroamphetamine (ADDERALL) 5 MG tablet Take 5-10 mg by mouth See admin instructions. 10 mg every morning and 5 mg every afternoon    [provider]  buPROPion (WELLBUTRIN XL) 300 MG 24 hr tablet Take 300 mg by mouth daily.      [provider]  cholecalciferol (VITAMIN D) 1000 units tablet Take 1,000 Units by mouth daily. Except on Wednesday and Sunday    [provider]  QUEtiapine (SEROQUEL) 50 MG tablet Take 50 mg by mouth at bedtime.      [provider]  Vitamin D, Ergocalciferol, (DRISDOL) 50000 units CAPS capsule Take 50,000 Units by mouth 2 (two) times a  week. On Wednesday and Sunday    [provider]  zolpidem (AMBIEN) 10 MG tablet Take 10 mg by mouth at bedtime.    [provider]    Allergies Patient has no known allergies.  No family history on file.  Social History Social History   Tobacco Use  . Smoking status: Never Smoker  . Smokeless tobacco: Never Used  Substance Use Topics  . Alcohol use: No  . Drug use: No    Review of Systems  Constitutional: Negative for fever. Eyes: Negative for visual changes. ENT: Negative for sore throat. Neck: No neck pain  Cardiovascular: Negative for chest pain. Respiratory: Negative for shortness of breath. Gastrointestinal: Negative for abdominal pain, vomiting or diarrhea. Genitourinary: Negative for dysuria. Musculoskeletal: Negative for back pain. + L ankle pain Skin: Negative for rash. Neurological: Negative for headaches, weakness or numbness. Psych: No SI or HI  ____________________________________________   PHYSICAL EXAM:  VITAL SIGNS: ED Triage Vitals  Enc Vitals Group     BP 07/09/18 0605 (!) 108/52     Pulse Rate 07/09/18 0605 77     Resp 07/09/18 0605 14     Temp 07/09/18 0605 97.7 F (36.5 C)     Temp src --      SpO2 07/09/18 0605 100 %     Weight 07/09/18 0549 235 lb (106.6 kg)     Height 07/09/18 0549 5\' 8"  (1.727 m)  Head Circumference --      Peak Flow --      Pain Score 07/09/18 0548 8     Pain Loc --      Pain Edu? --      Excl. in [place]? --     Constitutional: Alert and oriented. Well appearing and in no apparent distress. HEENT:      Head: Normocephalic and atraumatic.         Eyes: Conjunctivae are normal. Sclera is non-icteric.       Mouth/Throat: Mucous membranes are moist.       Neck: Supple with no signs of meningismus. Cardiovascular: Regular rate and rhythm. No murmurs, gallops, or rubs. 2+ symmetrical distal pulses are present in all extremities. No JVD. Respiratory: Normal respiratory effort. Lungs are clear to  auscultation bilaterally. No wheezes, crackles, or rhonchi.  Musculoskeletal: Significant swelling of the L ankle with tenderness to palpation over the medial and posterior malleoli, no tenderness over the lateral malleolus or metacarpal bones, no tenderness to palpation of the proximal fibula/ tibia. Intact pulses and brisk capillary refill Neurologic: Normal speech and language. Face is symmetric. Moving all extremities. No gross focal neurologic deficits are appreciated. Skin: Skin is warm, dry and intact. No rash noted. Psychiatric: Mood and affect are normal. Speech and behavior are normal.  ____________________________________________   LABS (all labs ordered are listed, but only abnormal results are displayed)  Labs Reviewed  CBC  BASIC METABOLIC PANEL  PROTIME-INR  TYPE AND SCREEN   ____________________________________________  EKG  none  ____________________________________________  RADIOLOGY  I have personally reviewed the images performed during this visit and I agree with the Radiologist's read.   Interpretation by Radiologist:  Dg Ankle Complete Left  Result Date: 07/09/2018 CLINICAL DATA:  Rolled ankle, deformity and pain. History of tendon surgery. EXAM: LEFT ANKLE COMPLETE - 3+ VIEW COMPARISON:  None. FINDINGS: Acute nondisplaced distal fibular fracture. Acute nondisplaced suspected posterior malleolus fracture. Mild widening of the medial ankle mortise. Tibiofibular syndesmosis intact. No destructive bony lesions. Soft tissue swelling without subcutaneous gas or radiopaque foreign bodies. IMPRESSION: Acute nondisplaced distal fibular and probable posterior malleolus fractures. Widened ankle mortise seen with ligament injury. Electronically Signed   By: Elon Alas M.D.   On: 07/09/2018 06:35     ____________________________________________   PROCEDURES  Procedure(s) performed:yes Procedures   SPLINT APPLICATION Date/Time: 7:06 AM Authorized by:  Rudene Re Consent: Verbal consent obtained. Risks and benefits: risks, benefits and alternatives were discussed Consent given by: patient Splint applied by: orthopedic technician Location details: L ankle Splint type: posterior slab and stirrup Supplies used: orthoglass Post-procedure: The splinted body part was neurovascularly unchanged following the procedure. Patient tolerance: Patient tolerated the procedure well with no immediate complications.    Critical Care performed:  None ____________________________________________   INITIAL IMPRESSION / ASSESSMENT AND PLAN / ED COURSE   62 y.o. female who presents for evaluation of left ankle pain.  Patient has significant swelling of the ankle with an x-ray that confirms an acute nondisplaced distal fibular fracture and posterior malleolus fracture. Patient is mostly tender over the posterior and medial malleolus. Discussed with Dr. Roland Rack who recommended surgical repair today. Patient made NPO, will get surgical labs and admit to orthopedics.      As part of my medical decision making, I reviewed the following data within the [place] notes reviewed and incorporated, Labs reviewed , Radiograph reviewed , A consult was requested and obtained from this/these consultant(s)  Orthopedist, Notes from prior ED visits and [place] Controlled Substance Database    Pertinent labs & imaging results that were available during my care of the patient were reviewed by me and considered in my medical decision making (see chart for details).    ____________________________________________   FINAL CLINICAL IMPRESSION(S) / ED DIAGNOSES  Final diagnoses:  Closed bimalleolar fracture of left ankle, initial encounter      NEW MEDICATIONS STARTED DURING THIS VISIT:  ED Discharge Orders    None       Note:  This document was prepared using Dragon voice recognition software and may include unintentional dictation  errors.    Alfred Levins, [place], MD 07/09/18 [id number]

## 2018-07-09 NOTE — H&P (Signed)
Subjective:  Chief complaint: Left ankle pain.  The patient is a 62 y.o. female who sustained an injury to the left ankle earlier this morning.  Apparently she got up around 4:00 a.m. to go the bathroom.  She thinks that her foot was asleep which caused her to step awkwardly on the ankle and rolled her ankle.  Following this injury, she was unable to bear weight so she had her family bring her to the emergency room where x-rays demonstrated a distal fibular fracture, a small posterior malleolar fracture, and widening of the medial mortise.  The patient denies any associated injury or loss of consciousness associated with the injury, and denies any light-headedness, dizziness, chest pain, or shortness of breath which might have contributed to the injury.  There are no active problems to display for this patient.  Past Medical History:  Diagnosis Date  . Depression   . PONV (postoperative nausea and vomiting)     Past Surgical History:  Procedure Laterality Date  .  vertical ring gastroplasty    . ABDOMINAL HYSTERECTOMY  05/1990  . CESAREAN SECTION  10/23/1989  . CHOLECYSTECTOMY    . COLONOSCOPY  04/28/2011   Procedure: COLONOSCOPY;  Surgeon: Dorothyann Peng, MD;  Location[facility];  Service: Endoscopy;  Laterality: N/A;     (Not in a hospital admission) No Known Allergies  Social History   Tobacco Use  . Smoking status: Never Smoker  . Smokeless tobacco: Never Used  Substance Use Topics  . Alcohol use: No    No family history on file.   Review of Systems: As noted above. The patient denies any chest pain, shortness of breath, nausea, vomiting, diarrhea, constipation, belly pain, blood in his/her stool, or burning with urination.  Objective: Temp:  [97.7 F (36.5 C)] 97.7 F (36.5 C) (09/28 0605) Pulse Rate:  [77] 77 (09/28 0605) Resp:  [14] 14 (09/28 0605) BP: (108)/(52) 108/52 (09/28 0605) SpO2:  [100 %] 100 % (09/28 0605) Weight:  [106.6 kg] 106.6 kg (09/28  0549)  Physical Exam: General:  Alert, no acute distress Psychiatric:  Patient is competent for consent with normal mood and affect Cardiovascular:  RRR  Respiratory:  Clear to auscultation. No wheezing. Non-labored breathing GI:  Abdomen is soft and non-tender Skin:  No lesions in the area of chief complaint Neurologic:  Sensation intact distally Lymphatic:  No axillary or cervical lymphadenopathy  Orthopedic Exam:  Orthopedic examination is limited to the left lower extremity and foot.  Skin inspection is notable for mild swelling around the ankle both medially and laterally.  There are no lacerations ecchymosis, erythema, or other skin normality is identified.  She has moderate tenderness to palpation over the medial more so than the lateral aspects of the ankle.  She has pain with any attempted active or passive motion of the ankle.  She is able to actively dorsiflex and plantarflex her toes.  Sensation is intact to light touch to all distributions.  She has good capillary refill to her toes.  Imaging Review: Recent x-rays of the left ankle are available for review.  These films demonstrate a mildly displaced posterior oblique fracture of the distal fibula, as well as a small posterior malleolar fragment and widening of the medial mortise no evidence for medial malleolar fracture.  No significant degenerative changes are identified.  No lytic lesions are noted either.  Assessment: Unstable closed bimalleolar left ankle fracture.  Plan: The treatment options, including both surgical and nonsurgical choices, have  been discussed in detail with the patient and her family. The risks (including bleeding, infection, nerve and/or blood vessel injury, persistent or recurrent pain, stiffness, malunion and/or nonunion, development of arthritis, need for further surgery, blood clots, strokes, heart attacks or arrhythmias, pneumonia, etc.) and benefits of the surgical procedure were discussed. The  patient states her understanding and agrees to proceed. She agrees to a blood transfusion if necessary. A formal written consent will be obtained by the nursing staff.

## 2018-07-09 NOTE — Anesthesia Post-op Follow-up Note (Signed)
Anesthesia QCDR form completed.        

## 2018-07-09 NOTE — Transfer of Care (Signed)
Immediate Anesthesia Transfer of Care Note  Patient: Kelly Schultz  Procedure(s) Performed: OPEN REDUCTION INTERNAL FIXATION (ORIF) ANKLE FRACTURE (Left Ankle)  Patient Location: PACU  Anesthesia Type:Spinal  Level of Consciousness: awake, alert  and oriented  Airway & Oxygen Therapy: Patient Spontanous Breathing  Post-op Assessment: Report given to RN and Post -op Vital signs reviewed and stable  Post vital signs: Reviewed  Last Vitals:  Vitals Value Taken Time  BP 72/49 07/09/2018 12:00 PM  Temp 36.1 C 07/09/2018 11:59 AM  Pulse 74 07/09/2018 12:03 PM  Resp 10 07/09/2018 12:03 PM  SpO2 98 % 07/09/2018 12:03 PM  Vitals shown include unvalidated device data.  Last Pain:  Vitals:   07/09/18 0900  PainSc: 5          Complications: No apparent anesthesia complications

## 2018-07-09 NOTE — Anesthesia Preprocedure Evaluation (Signed)
Anesthesia Evaluation  Patient identified by MRN, date of birth, ID band Patient awake    Reviewed: Allergy & Precautions, NPO status , Patient's Chart, lab work & pertinent test results  History of Anesthesia Complications (+) PONV and history of anesthetic complications  Airway Mallampati: III       Dental   Pulmonary neg sleep apnea, neg COPD,           Cardiovascular hypertension, Pt. on medications (-) Past MI and (-) CHF (-) dysrhythmias (-) Valvular Problems/Murmurs     Neuro/Psych neg Seizures Depression    GI/Hepatic Neg liver ROS, neg GERD  ,  Endo/Other  neg diabetes  Renal/GU negative Renal ROS     Musculoskeletal   Abdominal   Peds  Hematology   Anesthesia Other Findings   Reproductive/Obstetrics                             Anesthesia Physical Anesthesia Plan  ASA: II and emergent  Anesthesia Plan: Spinal   Post-op Pain Management:    Induction:   PONV Risk Score and Plan:   Airway Management Planned:   Additional Equipment:   Intra-op Plan:   Post-operative Plan:   Informed Consent: I have reviewed the patients History and Physical, chart, labs and discussed the procedure including the risks, benefits and alternatives for the proposed anesthesia with the patient or authorized representative who has indicated his/her understanding and acceptance.     Plan Discussed with:   Anesthesia Plan Comments:         Anesthesia Quick Evaluation

## 2018-07-09 NOTE — ED Triage Notes (Signed)
Pt reports getting out of bed and her left ankle turned. Pt reports hx of tendon repair in the same extremity. Pt is in NAD.

## 2018-07-09 NOTE — ED Notes (Signed)
Left ankle elevated and ice applied

## 2018-07-10 NOTE — Evaluation (Signed)
Physical Therapy Evaluation Patient Details Name: Kelly Schultz MRN: [id number] DOB: 27-Oct-1955 Today's Date: 07/10/2018   History of Present Illness  Kelly Schultz is a 63yo female who comes to [facility] on 9/28 after rolling her ankle, found to have ankle fracture, underwent ORIF on 9/28. 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   Clinical Impression  Pt admitted with above diagnosis. Pt currently with functional limitations due to the deficits listed below (see "PT Problem List"). Upon entry, pt in bed, no family/caregiver present. The pt is awake and agreeable to participate. Education on weightbearing status, NWB transfers, and RW use for transfers and AMB. Education on role of PT in acute post-op phase. Stairs deferred to next session. Functional mobility assessment demonstrates increased effort/time requirements, limited tolerance, but no need for physical assistance, whereas the patient performed these at a higher level of independence PTA. Pt will benefit from skilled PT intervention to increase independence and safety with basic mobility in preparation for discharge to the venue listed below.       Follow Up Recommendations Home health PT;Supervision - Intermittent(BUE strengthening, stairs training, and NWB Lt hip/knee exercises to prevent atrophy. )    Equipment Recommendations  Rolling walker with 5" wheels    Recommendations for Other Services       Precautions / Restrictions Precautions Precautions: Fall Restrictions Weight Bearing Restrictions: Yes LLE Weight Bearing: Non weight bearing      Mobility  Bed Mobility Overal bed mobility: Modified Independent             General bed mobility comments: additional time needed, antalgic  Transfers Overall transfer level: Needs assistance Equipment used: Rolling walker (2 wheeled) Transfers: Sit to/from Stand Sit to Stand: Min guard;Supervision;From  elevated surface         General transfer comment: Verbal cues for technique and safe use of RW, NWB transfers from elevated surface. performed 2x in session.   Ambulation/Gait Ambulation/Gait assistance: Min guard;Supervision Gait Distance (Feet): 60 Feet Assistive device: Rolling walker (2 wheeled) Gait Pattern/deviations: Antalgic Gait velocity: 0.5m/s    General Gait Details: tall trunk posture, good use of BUE to allow 2-point hop-to gait.   Stairs            Wheelchair Mobility    Modified Rankin (Stroke Patients Only)       Balance Overall balance assessment: Modified Independent;No apparent balance deficits (not formally assessed)                                           Pertinent Vitals/Pain Pain Assessment: 0-10 Pain Score: 3  Pain Location: inside of ankle  Pain Descriptors / Indicators: Burning;Aching Pain Intervention(s): Limited activity within patient's tolerance    Home Living Family/patient expects to be discharged to:: Private residence Living Arrangements: Spouse/significant other Available Help at Discharge: Family Type of Home: House Home Access: Stairs to enter Entrance Stairs-Rails: Chemical engineer of Steps: 5 Home Layout: One level Home Equipment: Environmental consultant - 4 wheels      Prior Function Level of Independence: Independent         Comments: 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      Hand Dominance   Dominant Hand: Right    Extremity/Trunk Assessment   Upper Extremity Assessment Upper Extremity Assessment: Overall WFL for  tasks assessed            Communication   Communication: No difficulties  Cognition Arousal/Alertness: Awake/alert Behavior During Therapy: WFL for tasks assessed/performed Overall Cognitive Status: Within Functional Limits for tasks assessed                                        General Comments      Exercises      Assessment/Plan    PT Assessment Patient needs continued PT services  PT Problem List Decreased activity tolerance;Decreased mobility       PT Treatment Interventions DME instruction;Stair training;Functional mobility training;Gait training;Therapeutic activities;Therapeutic exercise;Patient/family education    PT Goals (Current goals can be found in the Care Plan section)  Acute Rehab PT Goals Patient Stated Goal: return to home PT Goal Formulation: With patient Time For Goal Achievement: 07/24/18 Potential to Achieve Goals: Good    Frequency BID   Barriers to discharge        Co-evaluation               AM-PAC PT "6 Clicks" Daily Activity  Outcome Measure Difficulty turning over in bed (including adjusting bedclothes, sheets and blankets)?: A Lot Difficulty moving from lying on back to sitting on the side of the bed? : A Lot Difficulty sitting down on and standing up from a chair with arms (e.g., wheelchair, bedside commode, etc,.)?: A Lot Help needed moving to and from a bed to chair (including a wheelchair)?: A Lot Help needed walking in hospital room?: A Little Help needed climbing 3-5 steps with a railing? : A Lot 6 Click Score: 13    End of Session Equipment Utilized During Treatment: Gait belt Activity Tolerance: Patient limited by fatigue;Other (comment);Patient tolerated treatment well(painj increase when leg is lowered to floor) Patient left: in chair;with call bell/phone within reach Nurse Communication: Mobility status PT Visit Diagnosis: Other abnormalities of gait and mobility (R26.89);Difficulty in walking, not elsewhere classified (R26.2);Pain Pain - Right/Left: Left Pain - part of body: Ankle and joints of foot    Time: 3474-2595 PT Time Calculation (min) (ACUTE ONLY): 32 min   Charges:   PT Evaluation $PT Eval Low Complexity: 1 Low PT Treatments $Gait Training: 8-22 mins       11:13 AM, 07/10/18 Etta Grandchild, PT, DPT Physical  Therapist - [facility]  [phone] (Circle)    Hattie Aguinaldo C 07/10/2018, 11:12 AM

## 2018-07-10 NOTE — Anesthesia Postprocedure Evaluation (Signed)
Anesthesia Post Note  Patient: Kelly Schultz  Procedure(s) Performed: OPEN REDUCTION INTERNAL FIXATION (ORIF) ANKLE FRACTURE (Left Ankle)  Patient location during evaluation: Nursing Unit Anesthesia Type: Spinal Level of consciousness: oriented and awake and alert Pain management: pain level controlled Vital Signs Assessment: post-procedure vital signs reviewed and stable Respiratory status: spontaneous breathing and respiratory function stable Cardiovascular status: stable Postop Assessment: no headache, no backache and no apparent nausea or vomiting Anesthetic complications: no     Last Vitals:  Vitals:   07/10/18 0914 07/10/18 1106  BP: 118/67   Pulse: 64 75  Resp: 20   Temp: (!) 36.4 C   SpO2: 98% 99%    Last Pain:  Vitals:   07/10/18 0914  TempSrc: Oral  PainSc:                  Delvon Chipps K

## 2018-07-10 NOTE — Progress Notes (Signed)
   Subjective: 1 Day Post-Op Procedure(s) (LRB): OPEN REDUCTION INTERNAL FIXATION (ORIF) ANKLE FRACTURE (Left) Patient reports pain as 2 on 0-10 scale.   Patient is well, and has had no acute complaints or problems Denies any CP, SOB, ABD pain. We will continue therapy today.  Plan is to go Home after hospital stay.  Objective: Vital signs in last 24 hours: Temp:  [96.1 F (35.6 C)-98 F (36.7 C)] 98 F (36.7 C) (09/29 0406) Pulse Rate:  [47-79] 67 (09/29 0406) Resp:  [11-21] 18 (09/29 0406) BP: (72-144)/(36-74) 106/63 (09/29 0406) SpO2:  [94 %-100 %] 95 % (09/29 0406)  Intake/Output from previous day: 09/28 0701 - 09/29 0700 In: 3366.5 [I.V.:3114.7; IV Piggyback:251.8] Out: 2660 [Urine:2650; Blood:10] Intake/Output this shift: No intake/output data recorded.  Recent Labs    07/09/18 0734  HGB 12.0   Recent Labs    07/09/18 0734  WBC 8.0  RBC 3.71*  HCT 33.0*  PLT 331   Recent Labs    07/09/18 0734  NA 131*  K 4.3  CL 96*  CO2 26  BUN 18  CREATININE 1.05*  GLUCOSE 110*  CALCIUM 9.2   Recent Labs    07/09/18 0734  INR 0.90    EXAM General - Patient is Alert, Appropriate and Oriented Right lower extremity - Neurovascular intact Sensation intact distally Intact pulses distally Dorsiflexion/Plantar flexion intact No cellulitis present Compartment soft  Left lower extremity -cast is intact, patient able to wiggle toes with good sensation intact distally.  No significant edema throughout the visualized portion of the foot and proximal tib-fib region.  Splint is well fitting causing no signs of irritation.  No drainage noted.   Past Medical History:  Diagnosis Date  . Depression   . PONV (postoperative nausea and vomiting)     Assessment/Plan:   1 Day Post-Op Procedure(s) (LRB): OPEN REDUCTION INTERNAL FIXATION (ORIF) ANKLE FRACTURE (Left) Active Problems:   Ankle fracture, left  Estimated body mass index is 35.73 kg/m as calculated from the  following:   Height as of this encounter: 5\' 8"  (1.727 m).   Weight as of this encounter: 106.6 kg. Advance diet Up with therapy, nonweightbearing left lower extremity Pain well controlled Vital signs are stable. Care management to assist with discharge to home. Discharge home pending progress with PT, most likely will be Monday.  DVT Prophylaxis - Lovenox, TED hose and SCDs    T. Rachelle Hora, PA-C [place] 07/10/2018, 8:33 AM

## 2018-07-11 ENCOUNTER — Encounter: Payer: Self-pay | Admitting: Surgery

## 2018-07-11 DIAGNOSIS — S82842A Displaced bimalleolar fracture of left lower leg, initial encounter for closed fracture: Secondary | ICD-10-CM | POA: Insufficient documentation

## 2018-07-11 MED ORDER — TRAMADOL HCL 50 MG PO TABS: 50.0000 mg | ORAL_TABLET | Freq: Four times a day (QID) | ORAL | 0 refills | Status: DC | PRN

## 2018-07-11 MED ORDER — ASPIRIN EC 325 MG PO TBEC: 325.0000 mg | DELAYED_RELEASE_TABLET | Freq: Every day | ORAL | 0 refills | Status: DC

## 2018-07-11 MED ORDER — OXYCODONE HCL 5 MG PO TABS: 5.0000 mg | ORAL_TABLET | ORAL | 0 refills | Status: DC | PRN

## 2018-07-11 NOTE — Progress Notes (Signed)
Physical Therapy Treatment Patient Details Name: Kelly Schultz MRN: [id number] DOB: 1955-10-30 Today's Date: 07/11/2018    History of Present Illness Kelly Schultz is a 62yo female who comes to [facility] on 9/28 after rolling her ankle, found to have ankle fracture, underwent ORIF on 9/28. 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     PT Comments    Pt agreeable to PT; 7/10 pain in L ankle post stair training and with LLE in dependent position. Use of w/c to stairs as pt unable to tolerate ambulation greater than 25-30 ft at this time, as well as perform steps consecutively. Pt requires scoot technique to manage steps with assist for holding LLE off stairs; Max A x 2 for rising to stand from floor and lowering to floor with use of rail. Pt has 6 STE home. Pt has many questions regarding home/work activity, swelling and swelling management, and car transfers. Education provided to pt and spouse. Pt significantly limited in mobility at this time. Encouraged pt to place herself and healing as the number one priority; pt notes she can do work activities remotely and strongly encourage this for safety of pt until mobility much improved. Pt advised to seek surgeons restrictions and approvals for activity in/out of home and work with Psychologist, sport and exercise and work [place] for best guidelines.    Follow Up Recommendations  Home health PT;Supervision - Intermittent     Equipment Recommendations  Rolling walker with 5" wheels    Recommendations for Other Services       Precautions / Restrictions Precautions Precautions: Fall Restrictions Weight Bearing Restrictions: Yes LLE Weight Bearing: Non weight bearing    Mobility  Bed Mobility Overal bed mobility: Modified Independent             General bed mobility comments: Mild increased time  Transfers Overall transfer level: Needs assistance Equipment used: Rolling walker (2  wheeled) Transfers: Sit to/from Stand Sit to Stand: Min guard         General transfer comment: Increased time/guard to steady in stand and with stand pivot turns  Ambulation/Gait             General Gait Details: deferred for stair training; unable to tolerate both consecutively. Small distance to/from bed to chair with effortful hopping.   Stairs Stairs: Yes Stairs assistance: +2 physical assistance;Mod assist;Max assist Stair Management: Seated/boosting;Forwards;Backwards Number of Stairs: 6 General stair comments: Pt has 6 STE home. Assist to support LLE during stair ascend/descend. Max A x 2 to rise/descend STS from/to ground. Effortful.    Wheelchair Mobility    Modified Rankin (Stroke Patients Only)       Balance Overall balance assessment: Needs assistance Sitting-balance support: Bilateral upper extremity supported;Feet supported Sitting balance-Leahy Scale: Good     Standing balance support: Bilateral upper extremity supported Standing balance-Leahy Scale: Fair                              Cognition Arousal/Alertness: Awake/alert Behavior During Therapy: WFL for tasks assessed/performed Overall Cognitive Status: Within Functional Limits for tasks assessed                                        Exercises Other Exercises Other Exercises: Education on car transfers Other Exercises: Education post questioning from pt/spouse  regarding work/home activity Other Exercises: Education regarding ice and elevation and overall healing process Other Exercises: Education regarding home use of shower chair per questioning as well as management of obstacles in home. ie. rugs/dogs    General Comments        Pertinent Vitals/Pain Pain Assessment: 0-10 Pain Score: 7  Pain Location: L ankle post walk; medial/lateral Pain Descriptors / Indicators: Aching;Pressure;Sharp;Throbbing;Tightness Pain Intervention(s): Monitored during  session;Premedicated before session;Ice applied    Home Living                      Prior Function            PT Goals (current goals can now be found in the care plan section) Progress towards PT goals: Progressing toward goals    Frequency    BID      PT Plan Current plan remains appropriate    Co-evaluation              AM-PAC PT "6 Clicks" Daily Activity  Outcome Measure  Difficulty turning over in bed (including adjusting bedclothes, sheets and blankets)?: A Lot Difficulty moving from lying on back to sitting on the side of the bed? : A Little Difficulty sitting down on and standing up from a chair with arms (e.g., wheelchair, bedside commode, etc,.)?: Unable Help needed moving to and from a bed to chair (including a wheelchair)?: A Little Help needed walking in hospital room?: A Lot Help needed climbing 3-5 steps with a railing? : Total 6 Click Score: 12    End of Session Equipment Utilized During Treatment: Gait belt Activity Tolerance: Patient limited by fatigue;Other (comment);Patient tolerated treatment well;Patient limited by pain(weakness) Patient left: in bed;with call bell/phone within reach;with family/visitor present;Other (comment)(Ice applied L ankle)   PT Visit Diagnosis: Other abnormalities of gait and mobility (R26.89);Difficulty in walking, not elsewhere classified (R26.2);Pain Pain - Right/Left: Left Pain - part of body: Ankle and joints of foot     Time: 1110-1154 PT Time Calculation (min) (ACUTE ONLY): 44 min  Charges:  $Gait Training: 8-22 mins $Therapeutic Exercise: 8-22 mins $Therapeutic Activity: 8-22 mins                      [name], PTA 07/11/2018, 12:54 PM

## 2018-07-11 NOTE — Care Management Note (Signed)
Case Management Note  Patient Details  Name: Kelly Schultz MRN: [id number] Date of Birth: 01-23-1956  Subjective/Objective:  Met with patient at bedside to discus discharge planning. She lives with her spouse.  POD # 2 ORIF left ankle fracture. Provided patient with a list of home health providers. Referral to [facility] for HH[facility]. She will need a walker. Ordered from [place] from [facility].                  Action/Plan: [facility] for HHPT, Walker.   Expected Discharge Date:  07/11/18               Expected Discharge Plan:  [place]  In-House Referral:     Discharge planning Services  CM Consult  Post Acute Care Choice:  Durable Medical Equipment, Home Health Choice offered to:  Patient  DME Arranged:  Walker rolling DME Agency:     HH Arranged:  PT [place]:  [place]  Status of Service:  Completed, signed off  If discussed at [place] of Stay Meetings, dates discussed:    Additional Comments:  Kelly Mango, RN 07/11/2018, 1:41 PM

## 2018-07-11 NOTE — Progress Notes (Signed)
   Subjective: 2 Days Post-Op Procedure(s) (LRB): OPEN REDUCTION INTERNAL FIXATION (ORIF) ANKLE FRACTURE (Left) Patient reports pain as mild.   Patient is well, and has had no acute complaints or problems Denies any CP, SOB, ABD pain. We will continue therapy today.  Plan is to go Home after hospital stay. Pt is passing gas this AM without pain.  Objective: Vital signs in last 24 hours: Temp:  [97.5 F (36.4 C)-98.6 F (37 C)] 98.1 F (36.7 C) (09/30 0755) Pulse Rate:  [64-97] 97 (09/30 0755) Resp:  [18-20] 18 (09/30 0755) BP: (115-139)/(51-80) 127/80 (09/30 0755) SpO2:  [97 %-99 %] 98 % (09/30 0755)  Intake/Output from previous day: 09/29 0701 - 09/30 0700 In: 240 [P.O.:240] Out: -  Intake/Output this shift: No intake/output data recorded.  Recent Labs    07/09/18 0734  HGB 12.0   Recent Labs    07/09/18 0734  WBC 8.0  RBC 3.71*  HCT 33.0*  PLT 331   Recent Labs    07/09/18 0734  NA 131*  K 4.3  CL 96*  CO2 26  BUN 18  CREATININE 1.05*  GLUCOSE 110*  CALCIUM 9.2   Recent Labs    07/09/18 0734  INR 0.90    EXAM General - Patient is Alert, Appropriate and Oriented Right lower extremity - Neurovascular intact Sensation intact distally Intact pulses distally No cellulitis present Compartment soft  Left lower extremity -cast is intact, patient able to wiggle toes with good sensation intact distally.  No significant edema throughout the visualized portion of the foot and proximal tib-fib region.  Splint is well fitting causing no signs of irritation.  No drainage noted. Abdomen is soft with normal BS this AM.   Past Medical History:  Diagnosis Date  . Depression   . PONV (postoperative nausea and vomiting)     Assessment/Plan:   2 Days Post-Op Procedure(s) (LRB): OPEN REDUCTION INTERNAL FIXATION (ORIF) ANKLE FRACTURE (Left) Active Problems:   Ankle fracture, left  Estimated body mass index is 35.73 kg/m as calculated from the following:   Height as of this encounter: 5\' 8"  (1.727 m).   Weight as of this encounter: 106.6 kg. Up with therapy Discharge home with home health   Remain NWB to the left leg. Pain is well controlled. Will discharge home today on aspirin following morning session of PT. No personal history of blood clots. Care management to assist with discharge to home.  DVT Prophylaxis - Lovenox, TED hose and SCDs  Raquel Jmari Pelc, PA-C [place] 07/11/2018, 8:05 AM

## 2018-07-11 NOTE — Discharge Instructions (Signed)
Diet: As you were doing prior to hospitalization   Shower:  May shower but keep the wounds dry, use an occlusive plastic wrap, NO SOAKING IN TUB.  If the bandage gets wet, change with a clean dry gauze.  Dressing:  Remain in splint until first follow-up appointment.   Activity:  Increase activity slowly as tolerated, but follow the weight bearing instructions below.  No lifting or driving for 6 weeks.  Weight Bearing:   Non-weightbearing to the left lower extremity  Blood Clot Prevention: Take 1 325mg  aspirin tablet daily.  To prevent constipation: you may use a stool softener such as -  Colace (over the counter) 100 mg by mouth twice a day  Drink plenty of fluids (prune juice may be helpful) and high fiber foods Miralax (over the counter) for constipation as needed.    Itching:  If you experience itching with your medications, try taking only a single pain pill, or even half a pain pill at a time.  You may take up to 10 pain pills per day, and you can also use benadryl over the counter for itching or also to help with sleep.   Precautions:  If you experience chest pain or shortness of breath - call 911 immediately for transfer to the hospital emergency department!!  If you develop a fever greater that 101 F, purulent drainage from wound, increased redness or drainage from wound, or calf pain-Call [place]                                              Follow- Up Appointment:  Please call for an appointment to be seen in 2 weeks at [facility]

## 2018-07-11 NOTE — Discharge Summary (Signed)
Physician Discharge Summary  Patient ID: Kelly Schultz MRN: [id number] DOB/AGE: 1955-10-16 62 y.o.  Admit date: 07/09/2018 Discharge date: 07/11/2018  Admission Diagnoses:  Surgery, elective [Z41.9] Closed bimalleolar fracture of left ankle, initial encounter [S82.842A]   Discharge Diagnoses: Patient Active Problem List   Diagnosis Date Noted  . Ankle fracture, left 07/09/2018    Past Medical History:  Diagnosis Date  . Depression   . PONV (postoperative nausea and vomiting)      Transfusion: none   Consultants (if any):   Discharged Condition: Improved  Hospital Course: TYISHA CRESSY is an 61 y.o. female who was admitted 07/09/2018 with a diagnosis of bimalleolar ankle fracture and went to the operating room on 07/09/2018 and underwent the above named procedures.    Surgeries: Procedure(s): OPEN REDUCTION INTERNAL FIXATION (ORIF) ANKLE FRACTURE on 07/09/2018 Patient tolerated the surgery well. Taken to PACU where she was stabilized and then transferred to the orthopedic floor.  Started on Lovenox , SCD, TED hose. Heels elevated on bed with rolled towels. No evidence of DVT. Negative [place]. Physical therapy started on day #1 for gait training and transfer. OT started day #1 for ADL and assisted devices.  Patient's foley was d/c on day #1. Patient's IV was d/c on day #2.  On post op day #2 patient is making good progress with PT. Patient is stable and ready for discharge to home with HHPT.  Implants:  Biomet ALPS 4-hole distal fibular locking plate and screws  She was given perioperative antibiotics:  Anti-infectives (From admission, onward)   Start     Dose/Rate Route Frequency Ordered Stop   07/09/18 1600  ceFAZolin (ANCEF) IVPB 2g/100 mL premix     2 g 200 mL/hr over 30 Minutes Intravenous Every 6 hours 07/09/18 1438 07/10/18 0531    .  She was given sequential compression devices, early ambulation, and Aspirin for DVT prophylaxis.  She benefited maximally  from the hospital stay and there were no complications.    Recent vital signs:  Vitals:   07/10/18 2338 07/11/18 0755  BP: (!) 115/51 127/80  Pulse: 92 97  Resp: 19 18  Temp: 98.6 F (37 C) 98.1 F (36.7 C)  SpO2: 97% 98%    Recent laboratory studies:  Lab Results  Component Value Date   HGB 12.0 07/09/2018   HGB 13.1 10/15/2015   Lab Results  Component Value Date   WBC 8.0 07/09/2018   PLT 331 07/09/2018   Lab Results  Component Value Date   INR 0.90 07/09/2018   Lab Results  Component Value Date   NA 131 (L) 07/09/2018   K 4.3 07/09/2018   CL 96 (L) 07/09/2018   CO2 26 07/09/2018   BUN 18 07/09/2018   CREATININE 1.05 (H) 07/09/2018   GLUCOSE 110 (H) 07/09/2018    Discharge Medications:   Allergies as of 07/11/2018   No Known Allergies     Medication List    TAKE these medications   aspirin EC 325 MG tablet Take 1 tablet (325 mg total) by mouth daily.   buPROPion 300 MG 24 hr tablet Commonly known as:  WELLBUTRIN XL Take 300 mg by mouth daily.   lisinopril 20 MG tablet Commonly known as:  PRINIVIL,ZESTRIL Take 20 mg by mouth 2 (two) times daily.   oxyCODONE 5 MG immediate release tablet Commonly known as:  Oxy IR/ROXICODONE Take 1-2 tablets (5-10 mg total) by mouth every 4 (four) hours as needed for moderate pain (pain score 4-6).  QUEtiapine 100 MG tablet Commonly known as:  SEROQUEL Take 150 mg by mouth at bedtime.   simvastatin 10 MG tablet Commonly known as:  ZOCOR Take 10 mg by mouth daily.   traMADol 50 MG tablet Commonly known as:  ULTRAM Take 1 tablet (50 mg total) by mouth every 6 (six) hours as needed for moderate pain.   VYVANSE 40 MG capsule Generic drug:  lisdexamfetamine Take 40 mg by mouth daily.   zolpidem 10 MG tablet Commonly known as:  AMBIEN Take 10 mg by mouth at bedtime.            Durable Medical Equipment  (From admission, onward)         Start     Ordered   07/09/18 1439  DME 3 n 1  Once      07/09/18 1438          Diagnostic Studies: Dg Ankle 2 Views Left  Result Date: 07/09/2018 CLINICAL DATA:  ORIF left ankle fracture EXAM: DG C-ARM 61-120 MIN; LEFT ANKLE - 2 VIEW FLUOROSCOPY TIME:  23 seconds COMPARISON:  Left ankle radiographs-07/09/2018 FINDINGS: Six spot fluoroscopic intraoperative images of the left ankle are provided for review Images demonstrate the sequela of screw and sideplate fixation of obliquely oriented distal fibular fracture. There are 2 obliquely oriented cancellous screws at the main fracture site. Alignment appears near anatomic. There is a minimal amount of expected adjacent soft tissue swelling. No radiopaque foreign body. IMPRESSION: Post ORIF of distal fibular fracture without evidence of complication. Electronically Signed   By: Sandi Mariscal M.D.   On: 07/09/2018 12:22   Dg Ankle Complete Left  Result Date: 07/09/2018 CLINICAL DATA:  Rolled ankle, deformity and pain. History of tendon surgery. EXAM: LEFT ANKLE COMPLETE - 3+ VIEW COMPARISON:  None. FINDINGS: Acute nondisplaced distal fibular fracture. Acute nondisplaced suspected posterior malleolus fracture. Mild widening of the medial ankle mortise. Tibiofibular syndesmosis intact. No destructive bony lesions. Soft tissue swelling without subcutaneous gas or radiopaque foreign bodies. IMPRESSION: Acute nondisplaced distal fibular and probable posterior malleolus fractures. Widened ankle mortise seen with ligament injury. Electronically Signed   By: Elon Alas M.D.   On: 07/09/2018 06:35   Dg C-arm 1-60 Min  Result Date: 07/09/2018 CLINICAL DATA:  ORIF left ankle fracture EXAM: DG C-ARM 61-120 MIN; LEFT ANKLE - 2 VIEW FLUOROSCOPY TIME:  23 seconds COMPARISON:  Left ankle radiographs-07/09/2018 FINDINGS: Six spot fluoroscopic intraoperative images of the left ankle are provided for review Images demonstrate the sequela of screw and sideplate fixation of obliquely oriented distal fibular fracture. There are  2 obliquely oriented cancellous screws at the main fracture site. Alignment appears near anatomic. There is a minimal amount of expected adjacent soft tissue swelling. No radiopaque foreign body. IMPRESSION: Post ORIF of distal fibular fracture without evidence of complication. Electronically Signed   By: Sandi Mariscal M.D.   On: 07/09/2018 12:22   Dg C-arm 1-60 Min  Result Date: 07/09/2018 CLINICAL DATA:  ORIF left ankle fracture EXAM: DG C-ARM 61-120 MIN; LEFT ANKLE - 2 VIEW FLUOROSCOPY TIME:  23 seconds COMPARISON:  Left ankle radiographs-07/09/2018 FINDINGS: Six spot fluoroscopic intraoperative images of the left ankle are provided for review Images demonstrate the sequela of screw and sideplate fixation of obliquely oriented distal fibular fracture. There are 2 obliquely oriented cancellous screws at the main fracture site. Alignment appears near anatomic. There is a minimal amount of expected adjacent soft tissue swelling. No radiopaque foreign body. IMPRESSION: Post ORIF of  distal fibular fracture without evidence of complication. Electronically Signed   By: Sandi Mariscal M.D.   On: 07/09/2018 12:22    Disposition:     Follow-up Information    Lattie Corns, PA-C Follow up in 10 day(s).   Specialty:  Physician Assistant Why:  Electa Sniff information: [place] [phone]            Signed: Feliberto Gottron 07/11/2018, 8:10 AM

## 2018-09-29 ENCOUNTER — Other Ambulatory Visit (HOSPITAL_COMMUNITY): Payer: Self-pay | Admitting: Nurse Practitioner

## 2018-09-29 DIAGNOSIS — Z1231 Encounter for screening mammogram for malignant neoplasm of breast: Secondary | ICD-10-CM

## 2018-10-20 ENCOUNTER — Encounter (HOSPITAL_COMMUNITY): Payer: Self-pay

## 2018-10-20 ENCOUNTER — Ambulatory Visit (HOSPITAL_COMMUNITY)
Admission: RE | Admit: 2018-10-20 | Discharge: 2018-10-20 | Disposition: A | Discharge status: Home / Self Care | Payer: BC Managed Care – PPO | Source: Ambulatory Visit | Attending: Nurse Practitioner | Admitting: Nurse Practitioner

## 2018-10-20 DIAGNOSIS — Z1231 Encounter for screening mammogram for malignant neoplasm of breast: Secondary | ICD-10-CM

## 2019-05-12 ENCOUNTER — Other Ambulatory Visit (HOSPITAL_COMMUNITY): Payer: Self-pay | Admitting: Nephrology

## 2019-05-12 ENCOUNTER — Other Ambulatory Visit: Payer: Self-pay | Admitting: Nephrology

## 2019-05-12 DIAGNOSIS — N179 Acute kidney failure, unspecified: Secondary | ICD-10-CM

## 2019-05-12 DIAGNOSIS — N183 Chronic kidney disease, stage 3 unspecified: Secondary | ICD-10-CM

## 2019-05-29 ENCOUNTER — Ambulatory Visit
Admission: RE | Admit: 2019-05-29 | Discharge: 2019-05-29 | Disposition: A | Discharge status: Home / Self Care | Payer: BC Managed Care – PPO | Source: Ambulatory Visit | Attending: Nephrology | Admitting: Nephrology

## 2019-05-29 ENCOUNTER — Other Ambulatory Visit: Payer: Self-pay

## 2019-05-29 DIAGNOSIS — N183 Chronic kidney disease, stage 3 unspecified: Secondary | ICD-10-CM

## 2019-05-29 DIAGNOSIS — N179 Acute kidney failure, unspecified: Secondary | ICD-10-CM

## 2019-11-15 DIAGNOSIS — I10 Essential (primary) hypertension: Secondary | ICD-10-CM | POA: Insufficient documentation

## 2020-02-03 ENCOUNTER — Ambulatory Visit: Payer: BC Managed Care – PPO | Attending: Internal Medicine

## 2020-02-03 DIAGNOSIS — Z23 Encounter for immunization: Secondary | ICD-10-CM

## 2020-02-03 NOTE — Progress Notes (Signed)
   Covid-19 Vaccination Clinic  Name:  Kelly Schultz    MRN: [id number] DOB: 1956-06-10  02/03/2020  [name] was observed post Covid-19 immunization for 15 minutes without incident. She was provided with Vaccine Information Sheet and instruction to access the V-Safe system.   [name] was instructed to call 911 with any severe reactions post vaccine: Marland Kitchen Difficulty breathing  . Swelling of face and throat  . A fast heartbeat  . A bad rash all over body  . Dizziness and weakness   Immunizations Administered    Name Date Dose VIS Date Route   Pfizer COVID-19 Vaccine 02/03/2020  9:12 AM 0.3 mL 12/06/2018 Intramuscular   Manufacturer: [employer]   Lot: [id number]   [place]: [id number]

## 2020-02-24 ENCOUNTER — Ambulatory Visit: Payer: BC Managed Care – PPO | Attending: Oncology

## 2020-02-24 DIAGNOSIS — Z23 Encounter for immunization: Secondary | ICD-10-CM

## 2020-02-24 NOTE — Progress Notes (Signed)
   Covid-19 Vaccination Clinic  Name:  Kelly Schultz    MRN: [id number] DOB: [birth year]  02/24/2020  Kelly Schultz was observed post Covid-19 immunization for 15 minutes without incident. She was provided with Vaccine Information Sheet and instruction to access the V-Safe system.   Kelly Schultz was instructed to call 911 with any severe reactions post vaccine: Marland Kitchen Difficulty breathing  . Swelling of face and throat  . A fast heartbeat  . A bad rash all over body  . Dizziness and weakness   Immunizations Administered    Name Date Dose VIS Date Route   Pfizer COVID-19 Vaccine 02/24/2020  9:30 AM 0.3 mL 12/06/2018 Intramuscular   Manufacturer: [place]   Lot: [id number]   [place]: [id number]

## 2020-02-27 ENCOUNTER — Ambulatory Visit: Payer: BC Managed Care – PPO

## 2020-05-15 DIAGNOSIS — M25569 Pain in unspecified knee: Secondary | ICD-10-CM | POA: Insufficient documentation

## 2020-05-15 DIAGNOSIS — I129 Hypertensive chronic kidney disease with stage 1 through stage 4 chronic kidney disease, or unspecified chronic kidney disease: Secondary | ICD-10-CM | POA: Insufficient documentation

## 2020-05-15 DIAGNOSIS — M7670 Peroneal tendinitis, unspecified leg: Secondary | ICD-10-CM | POA: Insufficient documentation

## 2020-08-15 ENCOUNTER — Other Ambulatory Visit (HOSPITAL_COMMUNITY): Payer: Self-pay | Admitting: Family Medicine

## 2020-08-15 DIAGNOSIS — Z1231 Encounter for screening mammogram for malignant neoplasm of breast: Secondary | ICD-10-CM

## 2020-08-21 IMAGING — XA DG C-ARM 61-120 MIN
6 series · 6 of 6 positions shown · non-contrast
Comparison: Left ankle radiographs-07/09/2018

CLINICAL DATA: ORIF left ankle fracture

EXAM:
DG C-ARM 61-120 MIN; LEFT ANKLE - 2 VIEW
FLUOROSCOPY TIME:  23 seconds

[Series 1: cont. · 1 of 1 slices shown (1 of 6)]
[im 1/1]
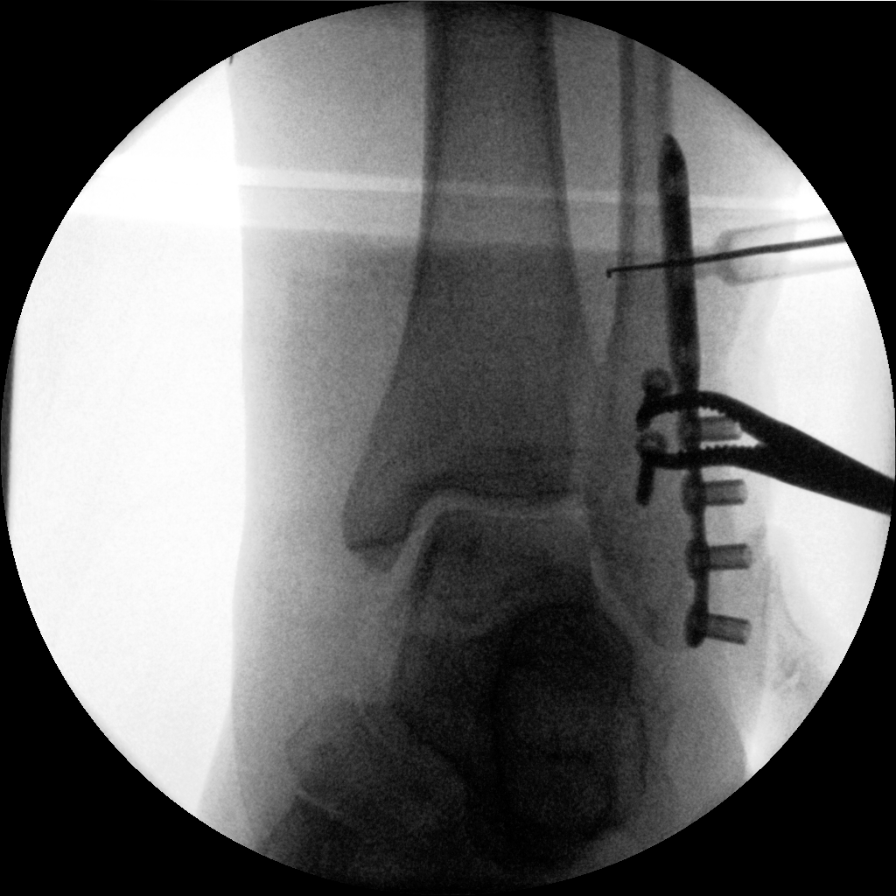

[Series 2: cont. · 1 of 1 slices shown (2 of 6)]
[im 1/1]
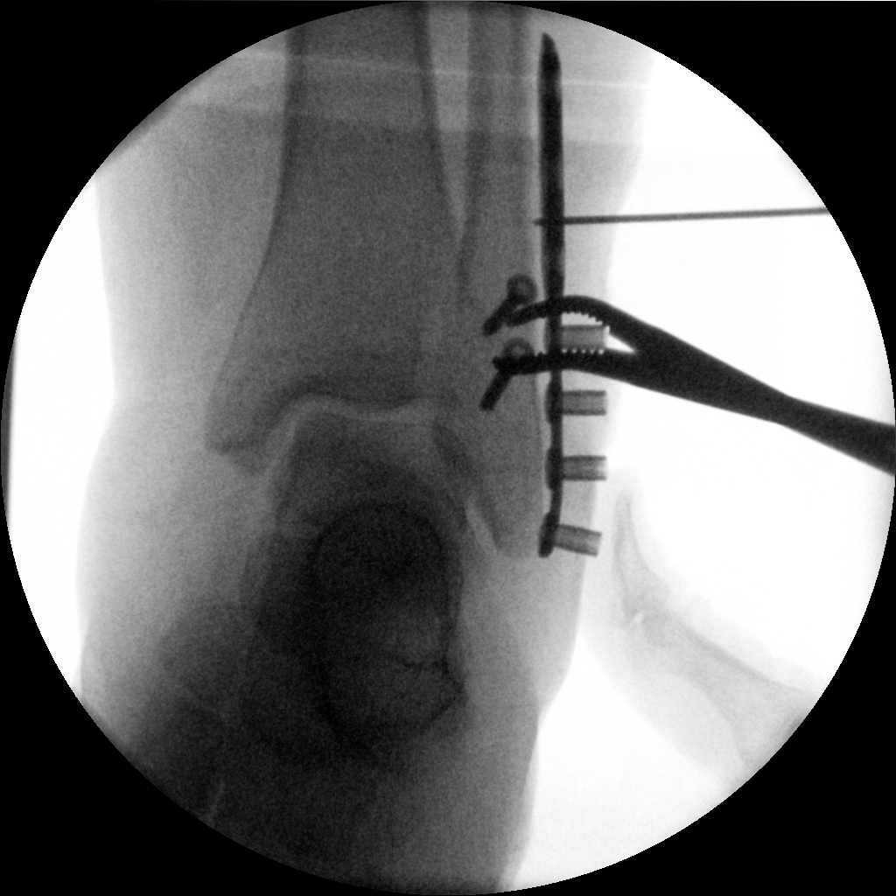

[Series 3: cont. · 1 of 1 slices shown (3 of 6)]
[im 1/1]
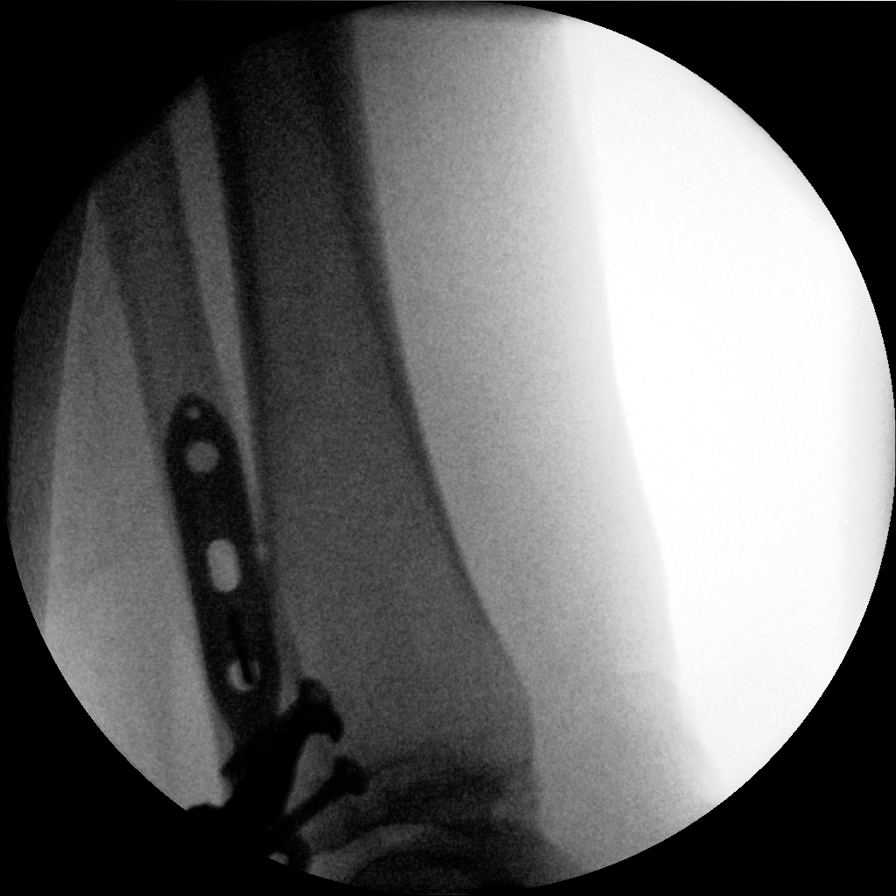

[Series 4: cont. · 1 of 1 slices shown (4 of 6)]
[im 1/1]
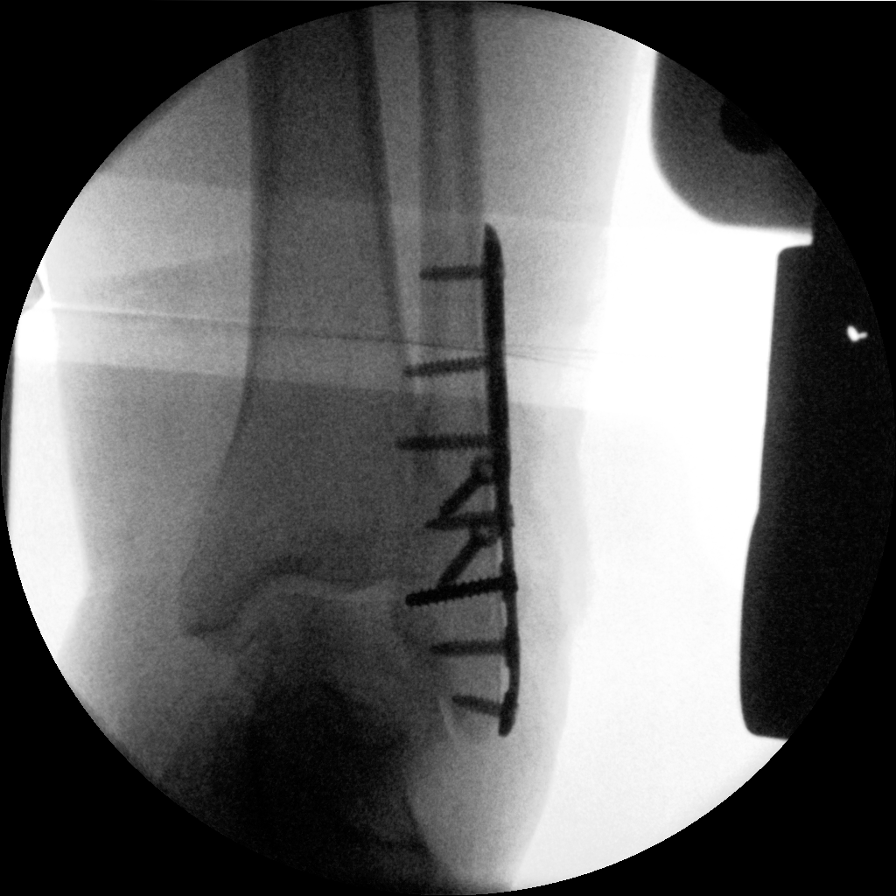

[Series 5: cont. · 1 of 1 slices shown (5 of 6)]
[im 1/1]
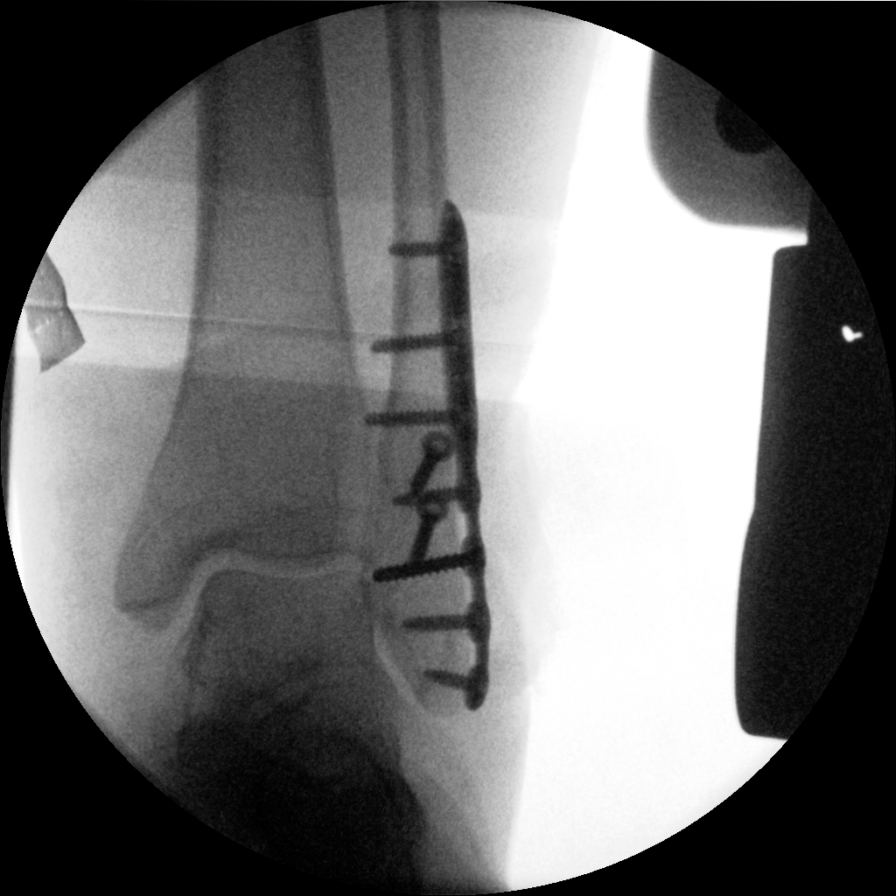

[Series 6: cont. · 1 of 1 slices shown (6 of 6)]
[im 1/1]
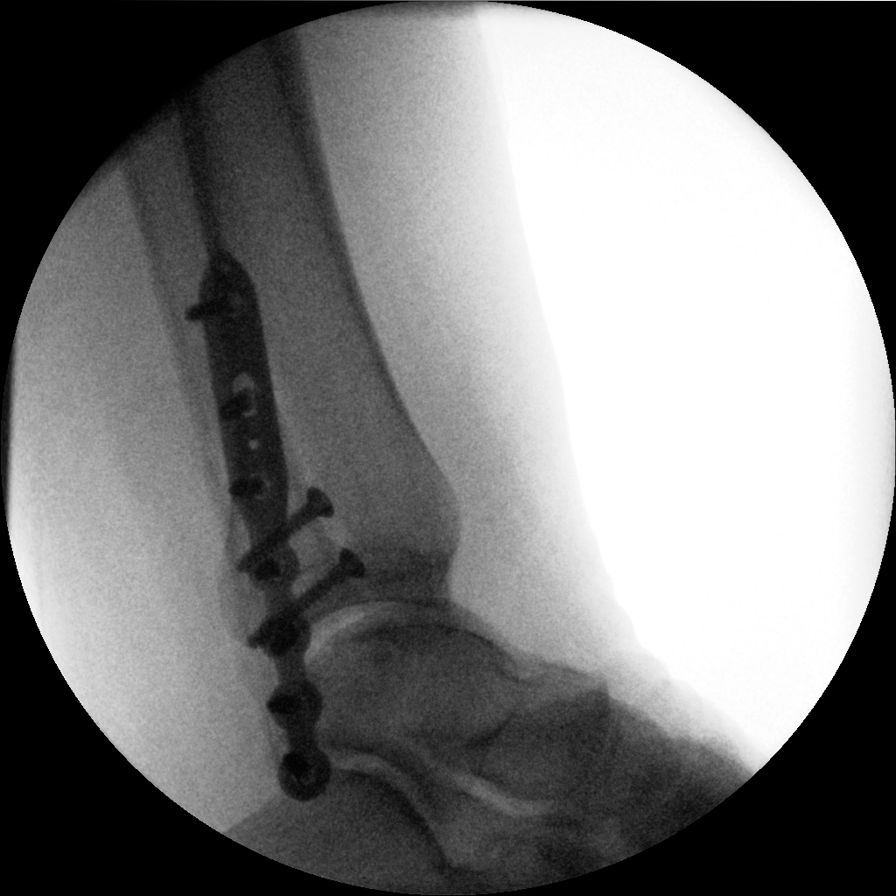

[6 of 6 positions shown; findings below may reference images not displayed]

FINDINGS: Six spot fluoroscopic intraoperative images of the left ankle are
provided for review

Images demonstrate the sequela of screw and sideplate fixation of
obliquely oriented distal fibular fracture. There are 2 obliquely
oriented cancellous screws at the main fracture site. Alignment
appears near anatomic. There is a minimal amount of expected
adjacent soft tissue swelling. No radiopaque foreign body.
IMPRESSION: Post ORIF of distal fibular fracture without evidence of
complication.

## 2020-10-01 DIAGNOSIS — N183 Chronic kidney disease, stage 3 unspecified: Secondary | ICD-10-CM | POA: Insufficient documentation

## 2020-10-01 DIAGNOSIS — E782 Mixed hyperlipidemia: Secondary | ICD-10-CM | POA: Insufficient documentation

## 2020-10-07 ENCOUNTER — Other Ambulatory Visit: Payer: Self-pay

## 2020-10-07 ENCOUNTER — Ambulatory Visit (HOSPITAL_COMMUNITY)
Admission: RE | Admit: 2020-10-07 | Discharge: 2020-10-07 | Disposition: A | Discharge status: Home / Self Care | Payer: BC Managed Care – PPO | Source: Ambulatory Visit | Attending: Family Medicine | Admitting: Family Medicine

## 2020-10-07 ENCOUNTER — Other Ambulatory Visit (HOSPITAL_COMMUNITY): Payer: Self-pay | Admitting: Family Medicine

## 2020-10-07 DIAGNOSIS — Z1231 Encounter for screening mammogram for malignant neoplasm of breast: Secondary | ICD-10-CM

## 2020-10-08 ENCOUNTER — Other Ambulatory Visit (HOSPITAL_COMMUNITY): Payer: Self-pay | Admitting: Family Medicine

## 2020-10-09 ENCOUNTER — Other Ambulatory Visit (HOSPITAL_COMMUNITY): Payer: Self-pay | Admitting: Family Medicine

## 2020-10-09 DIAGNOSIS — R928 Other abnormal and inconclusive findings on diagnostic imaging of breast: Secondary | ICD-10-CM

## 2020-10-10 ENCOUNTER — Other Ambulatory Visit: Payer: Self-pay

## 2020-10-10 ENCOUNTER — Ambulatory Visit (HOSPITAL_COMMUNITY)
Admission: RE | Admit: 2020-10-10 | Discharge: 2020-10-10 | Disposition: A | Discharge status: Home / Self Care | Payer: BC Managed Care – PPO | Source: Ambulatory Visit | Attending: Family Medicine | Admitting: Family Medicine

## 2020-10-10 ENCOUNTER — Encounter (HOSPITAL_COMMUNITY): Payer: Self-pay

## 2020-10-10 DIAGNOSIS — R928 Other abnormal and inconclusive findings on diagnostic imaging of breast: Secondary | ICD-10-CM

## 2021-08-15 DIAGNOSIS — Z23 Encounter for immunization: Secondary | ICD-10-CM | POA: Diagnosis not present

## 2021-08-29 ENCOUNTER — Other Ambulatory Visit (HOSPITAL_COMMUNITY): Payer: Self-pay | Admitting: Family Medicine

## 2021-08-29 DIAGNOSIS — Z1231 Encounter for screening mammogram for malignant neoplasm of breast: Secondary | ICD-10-CM

## 2021-10-28 ENCOUNTER — Ambulatory Visit: Payer: BC Managed Care – PPO | Admitting: Dermatology

## 2021-10-28 ENCOUNTER — Encounter: Payer: Self-pay | Admitting: Dermatology

## 2021-10-28 ENCOUNTER — Other Ambulatory Visit: Payer: Self-pay

## 2021-10-28 DIAGNOSIS — L7211 Pilar cyst: Secondary | ICD-10-CM

## 2021-10-28 DIAGNOSIS — L578 Other skin changes due to chronic exposure to nonionizing radiation: Secondary | ICD-10-CM

## 2021-10-28 DIAGNOSIS — L820 Inflamed seborrheic keratosis: Secondary | ICD-10-CM

## 2021-10-28 NOTE — Patient Instructions (Addendum)
Cryotherapy Aftercare  Wash gently with soap and water everyday.   Apply Vaseline and Band-Aid daily until healed.     If You Need Anything After Your Visit  If you have any questions or concerns for your doctor, please call our main line at [phone] and press option 4 to reach your doctor's medical assistant. If no one answers, please leave a voicemail as directed and we will return your call as soon as possible. Messages left after 4 pm will be answered the following business day.   You may also send [place] a message via [place]. We typically respond to MyChart messages within 1-2 business days.  For prescription refills, please ask your pharmacy to contact our office. Our fax number is [phone].  If you have an urgent issue when the clinic is closed that cannot wait until the next business day, you can page your doctor at the number below.    Please note that while we do our best to be available for urgent issues outside of office hours, we are not available 24/7.   If you have an urgent issue and are unable to reach [place], you may choose to seek medical care at your doctor's office, retail clinic, urgent care center, or emergency room.  If you have a medical emergency, please immediately call 911 or go to the emergency department.  Pager Numbers  - Dr. Nehemiah Massed: [phone]  - Dr. Laurence Ferrari: [phone]  - Dr. Nicole Kindred: [phone]  In the event of inclement weather, please call our main line at [phone] for an update on the status of any delays or closures.  Dermatology Medication Tips: Please keep the boxes that topical medications come in in order to help keep track of the instructions about where and how to use these. Pharmacies typically print the medication instructions only on the boxes and not directly on the medication tubes.   If your medication is too expensive, please contact our office at [phone] option 4 or send [place] a message through [place].   We are  unable to tell what your co-pay for medications will be in advance as this is different depending on your insurance coverage. However, we may be able to find a substitute medication at lower cost or fill out paperwork to get insurance to cover a needed medication.   If a prior authorization is required to get your medication covered by your insurance company, please allow [place] 1-2 business days to complete this process.  Drug prices often vary depending on where the prescription is filled and some pharmacies may offer cheaper prices.  The website www.goodrx.com contains coupons for medications through different pharmacies. The prices here do not account for what the cost may be with help from insurance (it may be cheaper with your insurance), but the website can give you the price if you did not use any insurance.  - You can print the associated coupon and take it with your prescription to the pharmacy.  - You may also stop by our office during regular business hours and pick up a GoodRx coupon card.  - If you need your prescription sent electronically to a different pharmacy, notify our office through [facility] or by phone at [phone] option 4.     Si Usted Necesita Algo Despus de Su Visita  Tambin puede enviarnos un mensaje a travs de Pharmacist, community. Por lo general respondemos a los mensajes de MyChart en el transcurso de 1 a 2 das hbiles.  Para renovar recetas, por  favor pida a su farmacia que se ponga en contacto con nuestra oficina. Harland Dingwall de fax es [place] [phone].  Si tiene un asunto urgente cuando la clnica est cerrada y que no puede esperar hasta el siguiente da hbil, puede llamar/localizar a su doctor(a) al nmero que aparece a continuacin.   Por favor, tenga en cuenta que aunque hacemos todo lo posible para estar disponibles para asuntos urgentes fuera del horario de [place], no estamos disponibles las 24 horas del da, los 7 das de la [place].   Si tiene un  problema urgente y no puede comunicarse con nosotros, puede optar por buscar atencin mdica  en el consultorio de su doctor(a), en una clnica privada, en un centro de atencin urgente o en una sala de emergencias.  Si tiene Engineering geologist, por favor llame inmediatamente al 911 o vaya a la sala de emergencias.  Nmeros de bper  - Dr. Nehemiah Massed: [phone]  - Dra. Moye: [phone]  - Dra. Nicole Kindred: [phone]  En caso de inclemencias del [place], por favor llame a Johnsie Kindred principal al [phone] para una actualizacin sobre el [place] de cualquier retraso o cierre.  Consejos para la medicacin en dermatologa: Por favor, guarde las cajas en las que vienen los medicamentos de uso tpico para ayudarle a seguir las instrucciones sobre dnde y cmo usarlos. Las farmacias generalmente imprimen las instrucciones del medicamento slo en las cajas y no directamente en los tubos del [place].   Si su medicamento es muy caro, por favor, pngase en contacto con Zigmund Daniel llamando al [phone] y presione la opcin 4 o envenos un mensaje a travs de Pharmacist, community.   No podemos decirle cul ser su copago por los medicamentos por adelantado ya que esto es diferente dependiendo de la cobertura de su seguro. Sin embargo, es posible que podamos encontrar un medicamento sustituto a Electrical engineer un formulario para que el seguro cubra el medicamento que se considera necesario.   Si se requiere una autorizacin previa para que su compaa de seguros [place] su medicamento, por favor permtanos de 1 a 2 das hbiles para completar este proceso.  Los precios de los medicamentos varan con frecuencia dependiendo del Environmental consultant de dnde se surte la receta y alguna farmacias pueden ofrecer precios ms baratos.  El sitio web www.goodrx.com tiene cupones para medicamentos de Airline pilot. Los precios aqu no tienen en cuenta lo que podra costar con la ayuda del seguro (puede ser ms  barato con su seguro), pero el sitio web puede darle el precio si no utiliz Research scientist (physical sciences).  - Puede imprimir el cupn correspondiente y llevarlo con su receta a la farmacia.  - Tambin puede pasar por nuestra oficina durante el horario de atencin regular y Charity fundraiser una tarjeta de cupones de GoodRx.  - Si necesita que su receta se enve electrnicamente a una farmacia diferente, informe a nuestra oficina a travs de MyChart de  o por telfono llamando al [phone] y presione la opcin 4. If You Need Anything After Your Visit  If you have any questions or concerns for your doctor, please call our main line at [phone] and press option 4 to reach your doctor's medical assistant. If no one answers, please leave a voicemail as directed and we will return your call as soon as possible. Messages left after 4 pm will be answered the following business day.   You may also send [place] a message via [place]. We typically respond to MyChart messages within 1-2  business days.  For prescription refills, please ask your pharmacy to contact our office. Our fax number is [phone].  If you have an urgent issue when the clinic is closed that cannot wait until the next business day, you can page your doctor at the number below.    Please note that while we do our best to be available for urgent issues outside of office hours, we are not available 24/7.   If you have an urgent issue and are unable to reach [place], you may choose to seek medical care at your doctor's office, retail clinic, urgent care center, or emergency room.  If you have a medical emergency, please immediately call 911 or go to the emergency department.  Pager Numbers  - Dr. Nehemiah Massed: [phone]  - Dr. Laurence Ferrari: [phone]  - Dr. Nicole Kindred: [phone]  In the event of inclement weather, please call our main line at [phone] for an update on the status of any delays or closures.  Dermatology Medication Tips: Please keep  the boxes that topical medications come in in order to help keep track of the instructions about where and how to use these. Pharmacies typically print the medication instructions only on the boxes and not directly on the medication tubes.   If your medication is too expensive, please contact our office at [phone] option 4 or send [place] a message through [place].   We are unable to tell what your co-pay for medications will be in advance as this is different depending on your insurance coverage. However, we may be able to find a substitute medication at lower cost or fill out paperwork to get insurance to cover a needed medication.   If a prior authorization is required to get your medication covered by your insurance company, please allow [place] 1-2 business days to complete this process.  Drug prices often vary depending on where the prescription is filled and some pharmacies may offer cheaper prices.  The website www.goodrx.com contains coupons for medications through different pharmacies. The prices here do not account for what the cost may be with help from insurance (it may be cheaper with your insurance), but the website can give you the price if you did not use any insurance.  - You can print the associated coupon and take it with your prescription to the pharmacy.  - You may also stop by our office during regular business hours and pick up a GoodRx coupon card.  - If you need your prescription sent electronically to a different pharmacy, notify our office through [facility] or by phone at [phone] option 4.     Si Usted Necesita Algo Despus de Su Visita  Tambin puede enviarnos un mensaje a travs de Pharmacist, community. Por lo general respondemos a los mensajes de MyChart en el transcurso de 1 a 2 das hbiles.  Para renovar recetas, por favor pida a su farmacia que se ponga en contacto con nuestra oficina. Harland Dingwall de fax es [place] [phone].  Si tiene un asunto urgente cuando  la clnica est cerrada y que no puede esperar hasta el siguiente da hbil, puede llamar/localizar a su doctor(a) al nmero que aparece a continuacin.   Por favor, tenga en cuenta que aunque hacemos todo lo posible para estar disponibles para asuntos urgentes fuera del horario de [place], no estamos disponibles las 24 horas del da, los 7 das de la [place].   Si tiene un problema urgente y no puede comunicarse con nosotros, puede optar por buscar atencin  mdica  en el consultorio de su doctor(a), en una clnica privada, en un centro de atencin urgente o en una sala de emergencias.  Si tiene Engineering geologist, por favor llame inmediatamente al 911 o vaya a la sala de emergencias.  Nmeros de bper  - Dr. Nehemiah Massed: [phone]  - Dra. Moye: [phone]  - Dra. Nicole Kindred: [phone]  En caso de inclemencias del [place], por favor llame a Johnsie Kindred principal al [phone] para una actualizacin sobre el [place] de cualquier retraso o cierre.  Consejos para la medicacin en dermatologa: Por favor, guarde las cajas en las que vienen los medicamentos de uso tpico para ayudarle a seguir las instrucciones sobre dnde y cmo usarlos. Las farmacias generalmente imprimen las instrucciones del medicamento slo en las cajas y no directamente en los tubos del [place].   Si su medicamento es muy caro, por favor, pngase en contacto con Zigmund Daniel llamando al [phone] y presione la opcin 4 o envenos un mensaje a travs de Pharmacist, community.   No podemos decirle cul ser su copago por los medicamentos por adelantado ya que esto es diferente dependiendo de la cobertura de su seguro. Sin embargo, es posible que podamos encontrar un medicamento sustituto a Electrical engineer un formulario para que el seguro cubra el medicamento que se considera necesario.   Si se requiere una autorizacin previa para que su compaa de seguros [place] su medicamento, por favor permtanos de 1 a 2 das  hbiles para completar este proceso.  Los precios de los medicamentos varan con frecuencia dependiendo del Environmental consultant de dnde se surte la receta y alguna farmacias pueden ofrecer precios ms baratos.  El sitio web www.goodrx.com tiene cupones para medicamentos de Airline pilot. Los precios aqu no tienen en cuenta lo que podra costar con la ayuda del seguro (puede ser ms barato con su seguro), pero el sitio web puede darle el precio si no utiliz Research scientist (physical sciences).  - Puede imprimir el cupn correspondiente y llevarlo con su receta a la farmacia.  - Tambin puede pasar por nuestra oficina durante el horario de atencin regular y Charity fundraiser una tarjeta de cupones de GoodRx.  - Si necesita que su receta se enve electrnicamente a una farmacia diferente, informe a nuestra oficina a travs de MyChart de [place] o por telfono llamando al [phone] y presione la opcin 4.

## 2021-10-28 NOTE — Progress Notes (Signed)
° °  New Patient Visit  Subjective  Kelly Schultz is a 66 y.o. female who presents for the following: Skin Problem (The patient has spots, moles and lesions to be evaluated, some may be new or changing and the patient has concerns that these could be cancer. ). She is most concerned about a growth on the scalp that seem to be getting larger and an irritating lesion on the right back.    The following portions of the chart were reviewed this encounter and updated as appropriate:   Tobacco   Allergies   Meds   Problems   Med Hx   Surg Hx   Fam Hx      Review of Systems:  No other skin or systemic complaints except as noted in HPI or Assessment and Plan.  Objective  Well appearing patient in no apparent distress; mood and affect are within normal limits.  A focused examination was performed including scalp,back . Relevant physical exam findings are noted in the Assessment and Plan.  right vertex scalp 1.5 cm Subcutaneous nodule at scalp.   right mid back lateral x 1 Stuck-on, waxy, tan-brown papule    Assessment & Plan  Pilar cyst right vertex scalp  Benign-appearing. Exam most consistent with an Pilar cyst. Discussed that a cyst is a benign growth that can grow over time and sometimes get irritated or inflamed. Recommend observation if it is not bothersome. Discussed option of surgical excision to remove it if it is growing, symptomatic, or other changes noted. Please call for new or changing lesions so they can be evaluated.    Inflamed seborrheic keratosis right mid back lateral x 1  Reassured benign age-related growth.  Recommend observation.  Discussed cryotherapy if spot(s) become irritated or inflamed.    Prior to procedure, discussed risks of blister formation, small wound, skin dyspigmentation, or rare scar following cryotherapy. Recommend Vaseline ointment to treated areas while healing.   Destruction of lesion - right mid back lateral x 1 Complexity: simple    Destruction method: cryotherapy   Informed consent: discussed and consent obtained   Timeout:  patient name, date of birth, surgical site, and procedure verified Lesion destroyed using liquid nitrogen: Yes   Region frozen until ice ball extended beyond lesion: Yes   Outcome: patient tolerated procedure well with no complications   Post-procedure details: wound care instructions given    Actinic Damage - chronic, secondary to cumulative UV radiation exposure/sun exposure over time - diffuse scaly erythematous macules with underlying dyspigmentation - Recommend daily broad spectrum sunscreen SPF 30+ to sun-exposed areas, reapply every 2 hours as needed.  - Recommend staying in the shade or wearing long sleeves, sun glasses (UVA+UVB protection) and wide brim hats (4-inch brim around the entire circumference of the hat). - Call for new or changing lesions.  Return for cyst surgery .  IMarye Round, CMA, am acting as scribe for Sarina Ser, MD .  Documentation: I have reviewed the above documentation for accuracy and completeness, and I agree with the above.  Sarina Ser, MD

## 2021-10-29 ENCOUNTER — Encounter (HOSPITAL_COMMUNITY): Payer: Self-pay

## 2021-10-29 ENCOUNTER — Ambulatory Visit (HOSPITAL_COMMUNITY): Payer: BC Managed Care – PPO

## 2021-11-17 DIAGNOSIS — F418 Other specified anxiety disorders: Secondary | ICD-10-CM | POA: Insufficient documentation

## 2021-11-17 DIAGNOSIS — F419 Anxiety disorder, unspecified: Secondary | ICD-10-CM | POA: Insufficient documentation

## 2021-11-17 DIAGNOSIS — H8110 Benign paroxysmal vertigo, unspecified ear: Secondary | ICD-10-CM | POA: Insufficient documentation

## 2021-11-17 DIAGNOSIS — K219 Gastro-esophageal reflux disease without esophagitis: Secondary | ICD-10-CM | POA: Insufficient documentation

## 2021-11-17 DIAGNOSIS — F909 Attention-deficit hyperactivity disorder, unspecified type: Secondary | ICD-10-CM | POA: Insufficient documentation

## 2021-11-17 DIAGNOSIS — E559 Vitamin D deficiency, unspecified: Secondary | ICD-10-CM | POA: Insufficient documentation

## 2021-11-17 DIAGNOSIS — F329 Major depressive disorder, single episode, unspecified: Secondary | ICD-10-CM | POA: Insufficient documentation

## 2021-11-25 ENCOUNTER — Other Ambulatory Visit (HOSPITAL_COMMUNITY): Payer: Self-pay | Admitting: Family Medicine

## 2021-11-25 DIAGNOSIS — Z1231 Encounter for screening mammogram for malignant neoplasm of breast: Secondary | ICD-10-CM

## 2021-12-01 ENCOUNTER — Ambulatory Visit (HOSPITAL_COMMUNITY)
Admission: RE | Admit: 2021-12-01 | Discharge: 2021-12-01 | Disposition: A | Discharge status: Home / Self Care | Payer: BC Managed Care – PPO | Source: Ambulatory Visit | Attending: Family Medicine | Admitting: Family Medicine

## 2021-12-01 ENCOUNTER — Other Ambulatory Visit: Payer: Self-pay

## 2021-12-01 DIAGNOSIS — Z1231 Encounter for screening mammogram for malignant neoplasm of breast: Secondary | ICD-10-CM | POA: Diagnosis present

## 2021-12-03 ENCOUNTER — Other Ambulatory Visit (HOSPITAL_COMMUNITY): Payer: Self-pay | Admitting: Family Medicine

## 2021-12-03 DIAGNOSIS — R928 Other abnormal and inconclusive findings on diagnostic imaging of breast: Secondary | ICD-10-CM

## 2021-12-04 ENCOUNTER — Other Ambulatory Visit: Payer: Self-pay

## 2021-12-04 ENCOUNTER — Ambulatory Visit (HOSPITAL_COMMUNITY)
Admission: RE | Admit: 2021-12-04 | Discharge: 2021-12-04 | Disposition: A | Discharge status: Home / Self Care | Payer: BC Managed Care – PPO | Source: Ambulatory Visit | Attending: Family Medicine | Admitting: Family Medicine

## 2021-12-04 DIAGNOSIS — R928 Other abnormal and inconclusive findings on diagnostic imaging of breast: Secondary | ICD-10-CM | POA: Diagnosis present

## 2021-12-16 ENCOUNTER — Encounter: Payer: BC Managed Care – PPO | Admitting: Dermatology

## 2022-02-17 ENCOUNTER — Encounter: Payer: BC Managed Care – PPO | Admitting: Dermatology

## 2022-04-21 ENCOUNTER — Ambulatory Visit (INDEPENDENT_AMBULATORY_CARE_PROVIDER_SITE_OTHER): Payer: BC Managed Care – PPO | Admitting: Dermatology

## 2022-04-21 DIAGNOSIS — D492 Neoplasm of unspecified behavior of bone, soft tissue, and skin: Secondary | ICD-10-CM

## 2022-04-21 DIAGNOSIS — L7211 Pilar cyst: Secondary | ICD-10-CM

## 2022-04-21 DIAGNOSIS — L2089 Other atopic dermatitis: Secondary | ICD-10-CM

## 2022-04-21 MED ORDER — TRIAMCINOLONE ACETONIDE 0.1 % EX CREA: 1.0000 | TOPICAL_CREAM | Freq: Two times a day (BID) | CUTANEOUS | 0 refills | Status: DC

## 2022-04-21 MED ORDER — MUPIROCIN 2 % EX OINT: 1.0000 | TOPICAL_OINTMENT | Freq: Every day | CUTANEOUS | 0 refills | Status: DC

## 2022-04-21 NOTE — Patient Instructions (Signed)

## 2022-04-21 NOTE — Progress Notes (Signed)
Follow-Up Visit   Subjective  Kelly Schultz is a 66 y.o. female who presents for the following: Cyst (R vertex scalp, pt presents for excision) and Other (Sore spot of right calf x months).  The following portions of the chart were reviewed this encounter and updated as appropriate:   Tobacco  Allergies  Meds  Problems  Med Hx  Surg Hx  Fam Hx     Review of Systems:  No other skin or systemic complaints except as noted in HPI or Assessment and Plan.  Objective  Well appearing patient in no apparent distress; mood and affect are within normal limits.  A focused examination was performed including scalp, right leg. Relevant physical exam findings are noted in the Assessment and Plan.  R vertex scalp Cystic pap 2.5 cm  Right calf Hyperkeratotic pink indurated plaque 5.0 x 2.5 cm        Assessment & Plan  Neoplasm of skin R vertex scalp  Skin excision  Lesion length (cm):  2.5 Lesion width (cm):  2.5 Margin per side (cm):  0 Total excision diameter (cm):  2.5 Informed consent: discussed and consent obtained   Timeout: patient name, date of birth, surgical site, and procedure verified   Procedure prep:  Patient was prepped and draped in usual sterile fashion Prep type:  Isopropyl alcohol and povidone-iodine Anesthesia: the lesion was anesthetized in a standard fashion   Anesthetic:  1% lidocaine w/ epinephrine 1-100,000 buffered w/ 8.4% NaHCO3 Instrument used: #15 blade   Hemostasis achieved with: pressure   Hemostasis achieved with comment:  Electrocautery Outcome: patient tolerated procedure well with no complications   Post-procedure details: sterile dressing applied and wound care instructions given   Dressing type: bandage and pressure dressing (Mupirocin)    Skin repair Complexity:  Complex Final length (cm):  2.5 Reason for type of repair: reduce tension to allow closure, reduce the risk of dehiscence, infection, and necrosis, reduce subcutaneous  dead space and avoid a hematoma, allow closure of the large defect, preserve normal anatomy, preserve normal anatomical and functional relationships and enhance both functionality and cosmetic results   Undermining: area extensively undermined   Undermining comment:  Undermining Defect 2.5 cm Subcutaneous layers (deep stitches):  Suture size:  4-0 Suture type: Vicryl (polyglactin 910)   Subcutaneous suture technique: Inverted Dermal. Fine/surface layer approximation (top stitches):  Stitches: simple running   Suture removal (days):  7 Hemostasis achieved with: pressure Outcome: patient tolerated procedure well with no complications   Post-procedure details: sterile dressing applied and wound care instructions given   Dressing type: bandage, pressure dressing and bacitracin (Mupirocin)    mupirocin ointment (BACTROBAN) 2 % Apply 1 Application topically daily. With dressing changes  Specimen 1 - Surgical pathology Differential Diagnosis: D48.5 Cyst vs other Check Margins: No  Other atopic dermatitis Right calf  Start TMC 0.1% cream bid - recheck on follow up  Atopic dermatitis (eczema) is a chronic, relapsing, pruritic condition that can significantly affect quality of life. It is often associated with allergic rhinitis and/or asthma and can require treatment with topical medications, phototherapy, or in severe cases biologic injectable medication (Dupixent; Adbry) or Oral JAK inhibitors.  triamcinolone cream (KENALOG) 0.1 % - Right calf Apply 1 Application topically 2 (two) times daily. Avoid face, groin, underarms  Return in about 1 week (around 04/28/2022) for suture removal, recheck atopic dermatitis.  I, Ashok Cordia, CMA, am acting as scribe for Sarina Ser, MD . Documentation: I have reviewed the above  documentation for accuracy and completeness, and I agree with the above.  Sarina Ser, MD

## 2022-04-22 ENCOUNTER — Telehealth: Payer: Self-pay

## 2022-04-22 NOTE — Telephone Encounter (Signed)
Left pt msg to call if any problems after yesterday's surgery./sh 

## 2022-04-28 ENCOUNTER — Ambulatory Visit (INDEPENDENT_AMBULATORY_CARE_PROVIDER_SITE_OTHER): Payer: BC Managed Care – PPO | Admitting: Dermatology

## 2022-04-28 DIAGNOSIS — L7211 Pilar cyst: Secondary | ICD-10-CM

## 2022-04-28 DIAGNOSIS — Z4802 Encounter for removal of sutures: Secondary | ICD-10-CM

## 2022-04-28 NOTE — Patient Instructions (Signed)
Due to recent changes in healthcare laws, you may see results of your pathology and/or laboratory studies on MyChart before the doctors have had a chance to review them. We understand that in some cases there may be results that are confusing or concerning to you. Please understand that not all results are received at the same time and often the doctors may need to interpret multiple results in order to provide you with the best plan of care or course of treatment. Therefore, we ask that you please give us 2 business days to thoroughly review all your results before contacting the office for clarification. Should we see a critical lab result, you will be contacted sooner.   If You Need Anything After Your Visit  If you have any questions or concerns for your doctor, please call our main line at [phone] and press option 4 to reach your doctor's medical assistant. If no one answers, please leave a voicemail as directed and we will return your call as soon as possible. Messages left after 4 pm will be answered the following business day.   You may also send us a message via MyChart. We typically respond to MyChart messages within 1-2 business days.  For prescription refills, please ask your pharmacy to contact our office. Our fax number is [phone].  If you have an urgent issue when the clinic is closed that cannot wait until the next business day, you can page your doctor at the number below.    Please note that while we do our best to be available for urgent issues outside of office hours, we are not available 24/7.   If you have an urgent issue and are unable to reach us, you may choose to seek medical care at your doctor's office, retail clinic, urgent care center, or emergency room.  If you have a medical emergency, please immediately call 911 or go to the emergency department.  Pager Numbers  - Dr. Kowalski: [phone]  - Dr. Moye: [phone]  - Dr. Stewart:  [phone]  In the event of inclement weather, please call our main line at [phone] for an update on the status of any delays or closures.  Dermatology Medication Tips: Please keep the boxes that topical medications come in in order to help keep track of the instructions about where and how to use these. Pharmacies typically print the medication instructions only on the boxes and not directly on the medication tubes.   If your medication is too expensive, please contact our office at [phone] option 4 or send us a message through MyChart.   We are unable to tell what your co-pay for medications will be in advance as this is different depending on your insurance coverage. However, we may be able to find a substitute medication at lower cost or fill out paperwork to get insurance to cover a needed medication.   If a prior authorization is required to get your medication covered by your insurance company, please allow us 1-2 business days to complete this process.  Drug prices often vary depending on where the prescription is filled and some pharmacies may offer cheaper prices.  The website www.goodrx.com contains coupons for medications through different pharmacies. The prices here do not account for what the cost may be with help from insurance (it may be cheaper with your insurance), but the website can give you the price if you did not use any insurance.  - You can print the associated coupon and take it with   your prescription to the pharmacy.  - You may also stop by our office during regular business hours and pick up a GoodRx coupon card.  - If you need your prescription sent electronically to a different pharmacy, notify our office through [place] MyChart or by phone at [phone] option 4.     Si Usted Necesita Algo Despus de Su Visita  Tambin puede enviarnos un mensaje a travs de MyChart. Por lo general respondemos a los mensajes de MyChart en el transcurso de 1 a 2  das hbiles.  Para renovar recetas, por favor pida a su farmacia que se ponga en contacto con nuestra oficina. Nuestro nmero de fax es el [phone].  Si tiene un asunto urgente cuando la clnica est cerrada y que no puede esperar hasta el siguiente da hbil, puede llamar/localizar a su doctor(a) al nmero que aparece a continuacin.   Por favor, tenga en cuenta que aunque hacemos todo lo posible para estar disponibles para asuntos urgentes fuera del horario de oficina, no estamos disponibles las 24 horas del da, los 7 das de la semana.   Si tiene un problema urgente y no puede comunicarse con nosotros, puede optar por buscar atencin mdica  en el consultorio de su doctor(a), en una clnica privada, en un centro de atencin urgente o en una sala de emergencias.  Si tiene una emergencia mdica, por favor llame inmediatamente al 911 o vaya a la sala de emergencias.  Nmeros de bper  - Dr. Kowalski: [phone]  - Dra. Moye: [phone]  - Dra. Stewart: [phone]  En caso de inclemencias del tiempo, por favor llame a nuestra lnea principal al [phone] para una actualizacin sobre el estado de cualquier retraso o cierre.  Consejos para la medicacin en dermatologa: Por favor, guarde las cajas en las que vienen los medicamentos de uso tpico para ayudarle a seguir las instrucciones sobre dnde y cmo usarlos. Las farmacias generalmente imprimen las instrucciones del medicamento slo en las cajas y no directamente en los tubos del medicamento.   Si su medicamento es muy caro, por favor, pngase en contacto con nuestra oficina llamando al [phone] y presione la opcin 4 o envenos un mensaje a travs de MyChart.   No podemos decirle cul ser su copago por los medicamentos por adelantado ya que esto es diferente dependiendo de la cobertura de su seguro. Sin embargo, es posible que podamos encontrar un medicamento sustituto a menor costo o llenar un formulario para que el  seguro cubra el medicamento que se considera necesario.   Si se requiere una autorizacin previa para que su compaa de seguros cubra su medicamento, por favor permtanos de 1 a 2 das hbiles para completar este proceso.  Los precios de los medicamentos varan con frecuencia dependiendo del lugar de dnde se surte la receta y alguna farmacias pueden ofrecer precios ms baratos.  El sitio web www.goodrx.com tiene cupones para medicamentos de diferentes farmacias. Los precios aqu no tienen en cuenta lo que podra costar con la ayuda del seguro (puede ser ms barato con su seguro), pero el sitio web puede darle el precio si no utiliz ningn seguro.  - Puede imprimir el cupn correspondiente y llevarlo con su receta a la farmacia.  - Tambin puede pasar por nuestra oficina durante el horario de atencin regular y recoger una tarjeta de cupones de GoodRx.  - Si necesita que su receta se enve electrnicamente a una farmacia diferente, informe a nuestra oficina a travs de MyChart de [place]   o por telfono llamando al [phone] y presione la opcin 4.  

## 2022-04-28 NOTE — Progress Notes (Signed)
   Follow-Up Visit   Subjective  BERNETTE SEEMAN is a 66 y.o. female who presents for the following: Post op./suture removal (R vertex scalp - pathology proven benign pilar cyst, patient is here today for suture removal).  The following portions of the chart were reviewed this encounter and updated as appropriate:   Tobacco  Allergies  Meds  Problems  Med Hx  Surg Hx  Fam Hx     Review of Systems:  No other skin or systemic complaints except as noted in HPI or Assessment and Plan.  Objective  Well appearing patient in no apparent distress; mood and affect are within normal limits.  A focused examination was performed including the scalp. Relevant physical exam findings are noted in the Assessment and Plan.  R vertex scalp Healing excision site.   Assessment & Plan  Pilar cyst R vertex scalp  Encounter for Removal of Sutures - Incision site at the R vertex scalp is clean, dry and intact - Wound cleansed, sutures removed, wound cleansed and steri strips applied.  - Discussed pathology results showing a benign pilar cyst  - Patient advised to keep steri-strips dry until they fall off. - Scars remodel for a full year. - Once steri-strips fall off, patient can apply over-the-counter silicone scar cream each night to help with scar remodeling if desired. - Patient advised to call with any concerns or if they notice any new or changing lesions.    Return if symptoms worsen or fail to improve.  Luther Redo, CMA, am acting as scribe for Sarina Ser, MD . Documentation: I have reviewed the above documentation for accuracy and completeness, and I agree with the above.  Sarina Ser, MD

## 2022-04-29 ENCOUNTER — Encounter: Payer: Self-pay | Admitting: Dermatology

## 2022-05-08 ENCOUNTER — Encounter: Payer: Self-pay | Admitting: Dermatology

## 2022-08-05 ENCOUNTER — Ambulatory Visit: Payer: BC Managed Care – PPO | Admitting: Dermatology

## 2022-08-05 DIAGNOSIS — L2089 Other atopic dermatitis: Secondary | ICD-10-CM | POA: Diagnosis not present

## 2022-08-05 DIAGNOSIS — L309 Dermatitis, unspecified: Secondary | ICD-10-CM

## 2022-08-05 MED ORDER — TACROLIMUS 0.1 % EX OINT: TOPICAL_OINTMENT | CUTANEOUS | 1 refills | Status: AC

## 2022-08-05 MED ORDER — CLOBETASOL PROPIONATE 0.05 % EX CREA: TOPICAL_CREAM | CUTANEOUS | 1 refills | Status: AC

## 2022-08-05 MED ORDER — TRIAMCINOLONE ACETONIDE 0.1 % EX CREA: TOPICAL_CREAM | CUTANEOUS | 1 refills | Status: DC

## 2022-08-05 MED ORDER — HYDROXYZINE HCL 25 MG PO TABS: ORAL_TABLET | ORAL | 1 refills | Status: DC

## 2022-08-05 NOTE — Patient Instructions (Addendum)
Start Clobetasol Cream - Apply to affected areas itchy rash (more severe areas) twice daily for up to 2 weeks or until rash improved. If rash not clear after 2 weeks, start triamcinolone cream once to twice daily until improved. Avoid face, groin, underarms. Topical steroids (such as triamcinolone, fluocinolone, fluocinonide, mometasone, clobetasol, halobetasol, betamethasone, hydrocortisone) can cause thinning and lightening of the skin if they are used for too long in the same area. Your physician has selected the right strength medicine for your problem and area affected on the body. Please use your medication only as directed by your physician to prevent side effects.   Start Tacrolimus ointment - apply to rash once to twice daily as needed. Ok to use on face, groin, underarms.   Start hydroxyzine '25mg'$  - take 1-2 tablets by mouth every night as needed for itch. May make drowsy. May start with 1/2 tablet.  Atopic Dermatitis  "Dermatitis" means inflammation of the skin.  "Atopic" dermatitis is a particular type of skin inflammation that is marked by dryness, associated itching, and a characteristic pattern of rash on the body.  The condition is fairly common and may occur in as many as 10% of children.  You will often hear it called "atopic eczema" or sometimes just "eczema".  The exact cause of atopic dermatitis is unknown.  In many patients, there is a family history of hay fever, asthma, or atopic dermatitis itself.  Rarely, atopic dermatitis in infants may be related to food sensitivity, such as sensitivity to milk, but this is often difficult to determine and manage.  In the majority of cases, however, no allergic triggers can be found.  Physical or emotional stressors (severe seasonal allergies, physical illness, etc.) can worsen atopic dermatitis.  Atopic dermatitis usually starts in infancy from the ages of 2 to 6 months.  The skin is dry and the rash is quite itchy, so infants may be restless  and rub against the sheets or scratch (if able).  The rash may involve the face or it may cover a large part of the body.  As the child gets older, the rash may become more localized.  In early childhood, the rash is commonly on the legs, feet, hands and arms.  As a child becomes older, the rash may be limited to the bend of the elbows, knees, on the back of the hands, feet, and on the neck and face.  When the rash becomes more established, the dry itchy skin may become thickened, leathery and sometimes darker in coloration.  The more the person scratches, the worse the rash is and the thicker the skin gets.  Many children with atopic dermatitis outgrow the condition before school age, while others continue to have problems into adolescence and adulthood.  Many things may affect the severity of the condition.  All patients have sensitive and dry skin.  Many will find that during the winter months when the humidity is very low, the dryness and itchiness will be worse.  On the other hand, some people are easily irritated by sweat and will find that they have more problems during the summer months.  Most patients note an increase in itching at times when there are sudden changes in temperature.  Other irritants easily affect the skin of a patient with atopic dermatitis.  Use of harsh soaps or detergents and exposure to wool are common problems.  Sometimes atopic dermatitis may become infected by bacteria, yeast or viruses.  This is called "secondary infection".  Bacterial secondary infection is the most common and is often a result of scratching.  The rash gets very red with pus-filled pimples and scabs.  If this occurs, your doctor will prescribe an antibiotic to control the infection.  A more serious complication can be caused by certain viruses.  The "cold sore" virus (herpes simplex) may cause a severe rash.  If this is suspected, immediately contact your doctor.   What can I expect from  treatment? Unfortunately, there is no "magic" cure that will always eliminate atopic dermatitis.  The main objective in treating atopic dermatitis is to decrease the skin eruption and relieve the itching.  There are a number of different forms of the medications that are used for atopic dermatitis.  Primarily, topical medications will be used.  Because the skin is excessively dry, moisturizers will be recommended that will effectively decrease the dryness.  Daily bathing is a useful way to get water into the skin but bathing should be brief (no more than 10 minutes unless otherwise indicated by your physician).  Effective moisturizers (Cetaphil cream or lotion, CeraVe cream or lotion [[employer], [employer]S, and Walgreens], Aquaphor, and plain Vaseline) can be used immediately after the bath or shower to trap moisture within the skin.  It is best to "pat dry" after a bathing and then place your moisturizer (cream or lotion) on your skin.  Cortisone (steroid) is a medicated ointment or cream (eg. triamcinolone, hydrocortisone, desonide, betamethasone, clobetasol) that may also be suggested.  It is very helpful in decreasing the itching and controlling the inflammation.  Your doctor will prescribe a cortisone treatment that is most appropriate for the severity and location of the dermatitis that is to be treated.   Once the affected area clears up, it is best to discontinue the use of the cortisone preparation due to possibility of atrophy (skin thinning), but continue the regular use of moisturizers to try to prevent new areas of dermatitis from occurring.  Of course, if itching or a new rash begins, the cortisone preparation may have to be started again.  Anti-inflammatory creams and ointments which are not steroids such as Protopic and Elidel may also be prescribed.  Certain internal medicines called antihistamines (eg. Atarax, Benadryl, hydroxyzine) may help control itching.  They primarily help with the itching by  introducing some drowsiness and allowing you to sleep at night.  Some oral antibiotics are often useful as well for controlling the secondary infection and enable infected dermatitis to be controlled.  Other important forms of treatment: Avoid contact with substances you know to cause itching.  These may include soaps, detergents, certain perfumes, dust, grass, weeds, wools, and other types of scratchy clothing. You may bathe daily.  Use no soap or the minimal amount necessary to get clean.  Always use moisturizer immediately after bathing (within 3 minutes is best).  Avoid very hot or very cold water.  Avoid bubble baths.  When drying with a towel, pat dry and do not rub. Use a mild, unscented soap (Dove, CeraVe Cleanser, Lever 2000, or Cetaphil). Try to keep the temperature and humidity in the home fairly constant.  Use a bedroom air conditioner in the summer and a humidifier in the winter.  It is very important that the humidifier be cleaned frequently and thoroughly since mold may grow and cause allergies. Try to avoid scratching.  Atopic dermatitis is often called "the itch that rashes" and it is known that scratching plays a significant role in making atopic dermatitis worse.  Keeping the nails short and well-filed is helpful. Use a fragrance-free, sensitive skin laundry detergent (eg. All Free & Clear).  Run clothes through a second rinse cycle to remove any residual detergents and chemicals.  Bed linens and towels should be washed in hot water to kill dust mites, which are common allergen in atopic patients. In the bedroom, minimize rugs and curtains or other loose fabrics that collect dust.  The National Eczema Association (www.eczema-assn.org) is a wonderful organization that sends out a Mudlogger with useful information on these types of conditions. Please consider contacting them at the above website or by address: National Eczema Association for Science and Education, [place], [place], [place], [place]       Due to recent changes in healthcare laws, you may see results of your pathology and/or laboratory studies on MyChart before the doctors have had a chance to review them. We understand that in some cases there may be results that are confusing or concerning to you. Please understand that not all results are received at the same time and often the doctors may need to interpret multiple results in order to provide you with the best plan of care or course of treatment. Therefore, we ask that you please give [place] 2 business days to thoroughly review all your results before contacting the office for clarification. Should we see a critical lab result, you will be contacted sooner.   If You Need Anything After Your Visit  If you have any questions or concerns for your doctor, please call our main line at [phone] and press option 4 to reach your doctor's medical assistant. If no one answers, please leave a voicemail as directed and we will return your call as soon as possible. Messages left after 4 pm will be answered the following business day.   You may also send [place] a message via [place]. We typically respond to MyChart messages within 1-2 business days.  For prescription refills, please ask your pharmacy to contact our office. Our fax number is [phone].  If you have an urgent issue when the clinic is closed that cannot wait until the next business day, you can page your doctor at the number below.    Please note that while we do our best to be available for urgent issues outside of office hours, we are not available 24/7.   If you have an urgent issue and are unable to reach [place], you may choose to seek medical care at your doctor's office, retail clinic, urgent care center, or emergency room.  If you have a medical emergency, please immediately call 911 or go to the emergency department.  Pager Numbers  - Dr. Nehemiah Massed: [phone]  -  Dr. Laurence Ferrari: [phone]  - Dr. Nicole Kindred: [phone]  In the event of inclement weather, please call our main line at [phone] for an update on the status of any delays or closures.  Dermatology Medication Tips: Please keep the boxes that topical medications come in in order to help keep track of the instructions about where and how to use these. Pharmacies typically print the medication instructions only on the boxes and not directly on the medication tubes.   If your medication is too expensive, please contact our office at [phone] option 4 or send [place] a message through [place].   We are unable to tell what your co-pay for medications will be in advance as this is different depending on your insurance coverage. However, we may be  able to find a substitute medication at lower cost or fill out paperwork to get insurance to cover a needed medication.   If a prior authorization is required to get your medication covered by your insurance company, please allow [place] 1-2 business days to complete this process.  Drug prices often vary depending on where the prescription is filled and some pharmacies may offer cheaper prices.  The website www.goodrx.com contains coupons for medications through different pharmacies. The prices here do not account for what the cost may be with help from insurance (it may be cheaper with your insurance), but the website can give you the price if you did not use any insurance.  - You can print the associated coupon and take it with your prescription to the pharmacy.  - You may also stop by our office during regular business hours and pick up a GoodRx coupon card.  - If you need your prescription sent electronically to a different pharmacy, notify our office through [facility] or by phone at [phone] option 4.     Si Usted Necesita Algo Despus de Su Visita  Tambin puede enviarnos un mensaje a travs de Pharmacist, community. Por lo general respondemos a los  mensajes de MyChart en el transcurso de 1 a 2 das hbiles.  Para renovar recetas, por favor pida a su farmacia que se ponga en contacto con nuestra oficina. Harland Dingwall de fax es [place] [phone].  Si tiene un asunto urgente cuando la clnica est cerrada y que no puede esperar hasta el siguiente da hbil, puede llamar/localizar a su doctor(a) al nmero que aparece a continuacin.   Por favor, tenga en cuenta que aunque hacemos todo lo posible para estar disponibles para asuntos urgentes fuera del horario de [place], no estamos disponibles las 24 horas del da, los 7 das de la [place].   Si tiene un problema urgente y no puede comunicarse con nosotros, puede optar por buscar atencin mdica  en el consultorio de su doctor(a), en una clnica privada, en un centro de atencin urgente o en una sala de emergencias.  Si tiene Engineering geologist, por favor llame inmediatamente al 911 o vaya a la sala de emergencias.  Nmeros de bper  - Dr. Nehemiah Massed: [phone]  - Dra. Moye: [phone]  - Dra. Nicole Kindred: [phone]  En caso de inclemencias del [place], por favor llame a Johnsie Kindred principal al [phone] para una actualizacin sobre el [place] de cualquier retraso o cierre.  Consejos para la medicacin en dermatologa: Por favor, guarde las cajas en las que vienen los medicamentos de uso tpico para ayudarle a seguir las instrucciones sobre dnde y cmo usarlos. Las farmacias generalmente imprimen las instrucciones del medicamento slo en las cajas y no directamente en los tubos del [place].   Si su medicamento es muy caro, por favor, pngase en contacto con Zigmund Daniel llamando al [phone] y presione la opcin 4 o envenos un mensaje a travs de Pharmacist, community.   No podemos decirle cul ser su copago por los medicamentos por adelantado ya que esto es diferente dependiendo de la cobertura de su seguro. Sin embargo, es posible que podamos encontrar un medicamento sustituto a  Electrical engineer un formulario para que el seguro cubra el medicamento que se considera necesario.   Si se requiere una autorizacin previa para que su compaa de seguros [place] su medicamento, por favor permtanos de 1 a 2 das hbiles para completar este proceso.  Los precios de los medicamentos varan con  frecuencia dependiendo del lugar de dnde se surte la receta y alguna farmacias pueden ofrecer precios ms baratos.  El sitio web www.goodrx.com tiene cupones para medicamentos de Airline pilot. Los precios aqu no tienen en cuenta lo que podra costar con la ayuda del seguro (puede ser ms barato con su seguro), pero el sitio web puede darle el precio si no utiliz Research scientist (physical sciences).  - Puede imprimir el cupn correspondiente y llevarlo con su receta a la farmacia.  - Tambin puede pasar por nuestra oficina durante el horario de atencin regular y Charity fundraiser una tarjeta de cupones de GoodRx.  - Si necesita que su receta se enve electrnicamente a una farmacia diferente, informe a nuestra oficina a travs de MyChart de [place] o por telfono llamando al [phone] y presione la opcin 4.

## 2022-08-05 NOTE — Progress Notes (Signed)
Follow-Up Visit   Subjective  Kelly Schultz is a 66 y.o. female who presents for the following: Rash (Lower legs, arms. Only using Eczema calming moisturizer. She ran out of TMC Cream about 1 month ago, but it helped when she used it previously. ). Very itchy. No history of eczema as a child. She does have a history of seasonal allergies. No history of asthma. She has a lot of stress at work. No changes in soaps or moisturizer. She uses Aveeno Oatmeal soap in shower.    The following portions of the chart were reviewed this encounter and updated as appropriate:       Review of Systems:  No other skin or systemic complaints except as noted in HPI or Assessment and Plan.  Objective  Well appearing patient in no apparent distress; mood and affect are within normal limits.  A focused examination was performed including arms, legs, neck. Relevant physical exam findings are noted in the Assessment and Plan.  arms, legs, neck Pink scaly patches on the right calf, left calf, posterior neck; pink scaly papules on the left elbow.    Assessment & Plan  Dermatitis arms, legs, neck  Probable atopic dermatitis. Chronic and persistent condition with duration or expected duration over one year. Condition is bothersome/symptomatic for patient. Currently flared.   Atopic dermatitis (eczema) is a chronic, relapsing, pruritic condition that can significantly affect quality of life. It is often associated with allergic rhinitis and/or asthma and can require treatment with topical medications, phototherapy, or in severe cases biologic injectable medication (Dupixent; Adbry) or Oral JAK inhibitors.  Recommend mild soap and moisturizing cream 1-2 times daily.  Gentle skin care handout provided. Continue Aveeno Oatmeal Soap.  Start clobetasol cream Apply BID to more severe areas up to 2 weeks or until clear, avoid f/g/a dsp 45g 1Rf. Caution skin atrophy with long-term use.   Continue TMC 0.1% Cream  QD/BID to less severe areas until improved dsp 80g 1Rf Topical steroids (such as triamcinolone, fluocinolone, fluocinonide, mometasone, clobetasol, halobetasol, betamethasone, hydrocortisone) can cause thinning and lightening of the skin if they are used for too long in the same area. Your physician has selected the right strength medicine for your problem and area affected on the body. Please use your medication only as directed by your physician to prevent side effects.   Start tacrolimus ointment Apply to Aas rash qd/bid until improved. Ok to use on face, skin folds. Dsp 60g 1Rf.  Start hydroxyzine '25mg'$  take 1-2 po qhs prn itch, may make drowsy dsp #60 1Rf.  Discussed Dupixent injections if patient not improving with topicals.     clobetasol cream (TEMOVATE) 0.05 % - arms, legs, neck Apply to itchy rash twice daily for up to 2 weeks or until improved. Avoid face, groin, underarms.  tacrolimus (PROTOPIC) 0.1 % ointment - arms, legs, neck Apply to AA rash QD/BID until improved. Ok to use on face, groin, skin folds.  hydrOXYzine (ATARAX) 25 MG tablet - arms, legs, neck Take 1-2 tablets by mouth at night as needed for itch.  Other atopic dermatitis  Related Medications triamcinolone cream (KENALOG) 0.1 % Apply to affected areas rash once to twice daily as needed until improved. Avoid face, groin, underarms   Return if symptoms worsen or fail to improve.  IJamesetta Orleans, CMA, am acting as scribe for Brendolyn Patty, MD .  Documentation: I have reviewed the above documentation for accuracy and completeness, and I agree with the above.  Brendolyn Patty  MD

## 2022-11-30 ENCOUNTER — Inpatient Hospital Stay (HOSPITAL_COMMUNITY): Admission: RE | Admit: 2022-11-30 | Payer: BC Managed Care – PPO | Source: Ambulatory Visit

## 2022-11-30 ENCOUNTER — Other Ambulatory Visit (HOSPITAL_COMMUNITY): Payer: Self-pay | Admitting: Family Medicine

## 2022-11-30 ENCOUNTER — Encounter (HOSPITAL_COMMUNITY): Payer: Self-pay

## 2022-11-30 DIAGNOSIS — Z1231 Encounter for screening mammogram for malignant neoplasm of breast: Secondary | ICD-10-CM

## 2022-12-04 ENCOUNTER — Ambulatory Visit (HOSPITAL_COMMUNITY)
Admission: RE | Admit: 2022-12-04 | Discharge: 2022-12-04 | Disposition: A | Discharge status: Home / Self Care | Payer: BC Managed Care – PPO | Source: Ambulatory Visit | Attending: Family Medicine | Admitting: Family Medicine

## 2022-12-04 DIAGNOSIS — Z1231 Encounter for screening mammogram for malignant neoplasm of breast: Secondary | ICD-10-CM | POA: Diagnosis not present

## 2023-11-04 ENCOUNTER — Encounter: Payer: Self-pay | Admitting: Dermatology

## 2023-11-04 ENCOUNTER — Ambulatory Visit: Payer: 59 | Admitting: Dermatology

## 2023-11-04 DIAGNOSIS — L309 Dermatitis, unspecified: Secondary | ICD-10-CM

## 2023-11-04 DIAGNOSIS — R0789 Other chest pain: Secondary | ICD-10-CM | POA: Insufficient documentation

## 2023-11-04 DIAGNOSIS — L2084 Intrinsic (allergic) eczema: Secondary | ICD-10-CM

## 2023-11-04 DIAGNOSIS — L209 Atopic dermatitis, unspecified: Secondary | ICD-10-CM

## 2023-11-04 DIAGNOSIS — G4700 Insomnia, unspecified: Secondary | ICD-10-CM | POA: Insufficient documentation

## 2023-11-04 DIAGNOSIS — Z79899 Other long term (current) drug therapy: Secondary | ICD-10-CM | POA: Diagnosis not present

## 2023-11-04 DIAGNOSIS — L2089 Other atopic dermatitis: Secondary | ICD-10-CM

## 2023-11-04 MED ORDER — HYDROXYZINE HCL 25 MG PO TABS: ORAL_TABLET | ORAL | 1 refills | Status: AC

## 2023-11-04 MED ORDER — DUPILUMAB 300 MG/2ML ~~LOC~~ SOAJ
600.0000 mg | SUBCUTANEOUS | Status: AC
  Administered 2023-11-04 – 2023-11-18 (×2): 600 mg via SUBCUTANEOUS

## 2023-11-04 MED ORDER — TRIAMCINOLONE ACETONIDE 0.1 % EX CREA: TOPICAL_CREAM | CUTANEOUS | 2 refills | Status: AC

## 2023-11-04 NOTE — Progress Notes (Signed)
Follow-Up Visit   Subjective  Kelly Schultz is a 68 y.o. female who presents for the following: Eczema, legs, elbows, post neck/upper back, currently otc gold bond eczema relief, Eucerin wash, Benadryl, in past pt has used TMC 0.1% cr, Clobetasol cr, Tacrolimus 0.1% oint, Hydroxyzine po,  The patient has spots, moles and lesions to be evaluated, some may be new or changing and the patient may have concern these could be cancer.   The following portions of the chart were reviewed this encounter and updated as appropriate: medications, allergies, medical history  Review of Systems:  No other skin or systemic complaints except as noted in HPI or Assessment and Plan.  Objective  Well appearing patient in no apparent distress; mood and affect are within normal limits.   A focused examination was performed of the following areas: Legs, arms, neck  Relevant exam findings are noted in the Assessment and Plan.    Assessment & Plan   ATOPIC DERMATITIS, failed triamcinolone, clobetasol, tacrolimus Legs, arms, post neck, upper back Exam: pink scaly eczematous patches bil lower legs 5% BSA  Chronic and persistent condition with duration over one year. Condition is symptomatic/ bothersome to patient. Not currently at goal.   Atopic dermatitis (eczema) is a chronic, relapsing, pruritic condition that can significantly affect quality of life. It is often associated with allergic rhinitis and/or asthma and can require treatment with topical medications, phototherapy, or in severe cases biologic injectable medication (Dupixent; Adbry) or Oral JAK inhibitors.  Treatment Plan: Restart TMC 0.1% cr qd/bid up to 2 weeks aa eczema legs, arms, back prn flares, avoid f/g/a Restart Hydroxyzine 25mg  1-2 po qhs prn itch, may make drowsy Discussed Dupixent injections, patient would like to start Dupixent Dupixent 300mg /80ml sq injections x 2 today to R and L upper arms, samples given, Lot [id number] exp  06/11/2025  Dupilumab (Dupixent) is a treatment given by injection for adults and children with moderate-to-severe atopic dermatitis. Goal is control of skin condition, not cure. It is given as 2 injections at the first dose followed by 1 injection ever 2 weeks thereafter.  Young children are dosed monthly.  Potential side effects include allergic reaction, injection site reactions and conjunctivitis (inflammation of the eyes).  The use of Dupixent requires long term medication management, including periodic office visits.  Topical steroids (such as triamcinolone, fluocinolone, fluocinonide, mometasone, clobetasol, halobetasol, betamethasone, hydrocortisone) can cause thinning and lightening of the skin if they are used for too long in the same area. Your physician has selected the right strength medicine for your problem and area affected on the body. Please use your medication only as directed by your physician to prevent side effects.   Long term medication management.  Patient is using long term (months to years) prescription medication  to control their dermatologic condition.  These medications require periodic monitoring to evaluate for efficacy and side effects and may require periodic laboratory monitoring.   Recommend gentle skin care.   INTRINSIC ATOPIC DERMATITIS   Related Medications Dupilumab SOAJ 600 mg  OTHER ATOPIC DERMATITIS   Related Medications triamcinolone cream (KENALOG) 0.1 % Apply to affected areas eczema arms, legs, back qd to bid up to 2 weeks, Avoid face, groin, underarms DERMATITIS   Related Medications clobetasol cream (TEMOVATE) 0.05 % Apply to itchy rash twice daily for up to 2 weeks or until improved. Avoid face, groin, underarms. tacrolimus (PROTOPIC) 0.1 % ointment Apply to AA rash QD/BID until improved. Ok to use on face, groin, skin  folds. hydrOXYzine (ATARAX) 25 MG tablet Take 1-2 tablets by mouth at night as needed for itch may make  drowsy LONG-TERM USE OF HIGH-RISK MEDICATION    Return in about 2 weeks (around 11/18/2023) for nurse visit Dupixent, 4 weeks Dr. Katrinka Blazing Atopic Derm.  I, Ardis Rowan, RMA, am acting as scribe for Elie Goody, MD .   Documentation: I have reviewed the above documentation for accuracy and completeness, and I agree with the above.  Elie Goody, MD

## 2023-11-04 NOTE — Patient Instructions (Signed)

## 2023-11-18 ENCOUNTER — Ambulatory Visit: Payer: Medicare PPO

## 2023-11-18 DIAGNOSIS — L2084 Intrinsic (allergic) eczema: Secondary | ICD-10-CM | POA: Diagnosis not present

## 2023-11-18 NOTE — Progress Notes (Signed)
 Patient here today for two week severe atopic dermatitis Dupixent  injection.   Dupixent  300mg  syringe injected into right upper arm. Patient tolerated injection well.  LOT: [id number]  EXP: 05/2025  Lisbeth Rides, RMA

## 2023-11-25 ENCOUNTER — Telehealth: Payer: Self-pay

## 2023-11-25 MED ORDER — DUPIXENT 300 MG/2ML ~~LOC~~ SOAJ: 300.0000 mg | SUBCUTANEOUS | 5 refills | Status: AC

## 2023-11-25 NOTE — Telephone Encounter (Signed)
Dupixent RX to [facility] to start process. aw

## 2023-12-02 ENCOUNTER — Encounter: Payer: Self-pay | Admitting: Dermatology

## 2023-12-02 ENCOUNTER — Ambulatory Visit: Payer: Medicare PPO | Admitting: Dermatology

## 2023-12-02 ENCOUNTER — Ambulatory Visit: Payer: 59 | Admitting: Dermatology

## 2023-12-02 DIAGNOSIS — Z79899 Other long term (current) drug therapy: Secondary | ICD-10-CM

## 2023-12-02 DIAGNOSIS — L299 Pruritus, unspecified: Secondary | ICD-10-CM | POA: Diagnosis not present

## 2023-12-02 DIAGNOSIS — L2089 Other atopic dermatitis: Secondary | ICD-10-CM | POA: Diagnosis not present

## 2023-12-02 DIAGNOSIS — L2084 Intrinsic (allergic) eczema: Secondary | ICD-10-CM

## 2023-12-02 DIAGNOSIS — Z7189 Other specified counseling: Secondary | ICD-10-CM

## 2023-12-02 MED ORDER — DUPILUMAB 300 MG/2ML ~~LOC~~ SOAJ
300.0000 mg | Freq: Once | SUBCUTANEOUS | Status: AC
Start: 2023-12-02 — End: 2023-12-02
  Administered 2023-12-02: 300 mg via SUBCUTANEOUS

## 2023-12-02 NOTE — Progress Notes (Signed)
   Follow-Up Visit   Subjective  Kelly Schultz is a 68 y.o. female who presents for the following: Atopic Dermatitis at neck, legs, arms and back.  Patient has only had 2 doses, rx has been sent to [facility]. Patient using TMC 0.1% cr on occasion. She has not needed to take hydroxyzine in the last 2 weeks. Patient advises she is mucg improved with an occasional itch at back and neck ,no breakouts today.   The following portions of the chart were reviewed this encounter and updated as appropriate: medications, allergies, medical history  Review of Systems:  No other skin or systemic complaints except as noted in HPI or Assessment and Plan.  Objective  Well appearing patient in no apparent distress; mood and affect are within normal limits.  Areas Examined: Neck, face  Relevant physical exam findings are noted in the Assessment and Plan.    Assessment & Plan   LONG-TERM USE OF HIGH-RISK MEDICATION   Related Medications Dupilumab SOAJ 300 mg  INTRINSIC ATOPIC DERMATITIS   Related Medications Dupilumab SOAJ 300 mg    ATOPIC DERMATITIS with Pruritis Exam: pinkness at posterior neck, much improved 5% BSA   Chronic and persistent condition with duration or expected duration over one year. Condition is improving with treatment but not currently at goal   Atopic dermatitis - Severe, on Dupixent (biologic medication).  Atopic dermatitis (eczema) is a chronic, relapsing, pruritic condition that can significantly affect quality of life. It is often associated with allergic rhinitis and/or asthma and can require treatment with topical medications, phototherapy, or in severe cases biologic medications, which require long term medication management.    Treatment Plan:  Continue Dupixent 300 mg/25mL SQ QOW. Patient denies side effects.  Patient self-injected a sample of Dupixent 300mg /92mL SQ into the right thigh. Patient tolerated injection well.   NDC [id number] Lot #  [id number]  Exp: 06/11/2025   Potential side effects include allergic reaction, herpes infections, injection site reactions and conjunctivitis (inflammation of the eyes).  The use of Dupixent requires long term medication management, including periodic office visits.    Long term medication management.  Patient is using long term (months to years) prescription medication  to control their dermatologic condition.  These medications require periodic monitoring to evaluate for efficacy and side effects and may require periodic laboratory monitoring.   Recommend gentle skin care.   Return in about 6 months (around 05/31/2024) for Dermatitis, Dupixent, with Dr. Beverlyn Roux, RMA, am acting as scribe for Armida Sans, MD .   Documentation: I have reviewed the above documentation for accuracy and completeness, and I agree with the above.  Armida Sans, MD

## 2023-12-06 ENCOUNTER — Encounter: Payer: Self-pay | Admitting: Dermatology

## 2023-12-30 ENCOUNTER — Other Ambulatory Visit (HOSPITAL_COMMUNITY): Payer: Self-pay | Admitting: Family Medicine

## 2023-12-30 DIAGNOSIS — Z1231 Encounter for screening mammogram for malignant neoplasm of breast: Secondary | ICD-10-CM

## 2024-01-03 ENCOUNTER — Inpatient Hospital Stay (HOSPITAL_COMMUNITY): Admission: RE | Admit: 2024-01-03 | Source: Ambulatory Visit

## 2024-01-12 ENCOUNTER — Ambulatory Visit (HOSPITAL_COMMUNITY)
Admission: RE | Admit: 2024-01-12 | Discharge: 2024-01-12 | Disposition: A | Discharge status: Home / Self Care | Source: Ambulatory Visit | Attending: Family Medicine | Admitting: Family Medicine

## 2024-01-12 DIAGNOSIS — Z1231 Encounter for screening mammogram for malignant neoplasm of breast: Secondary | ICD-10-CM | POA: Insufficient documentation

## 2024-06-15 ENCOUNTER — Ambulatory Visit: Payer: Medicare PPO | Admitting: Dermatology
